# Patient Record
Sex: Female | Born: 1945 | Race: White | Hispanic: No | Smoking: Former smoker
Health system: Southern US, Community
[De-identification: ages and names within clinical notes are randomized; demographics above are authoritative.]

## PROBLEM LIST (undated history)

## (undated) DIAGNOSIS — I4891 Unspecified atrial fibrillation: Secondary | ICD-10-CM

## (undated) DIAGNOSIS — C320 Malignant neoplasm of glottis: Secondary | ICD-10-CM

## (undated) DIAGNOSIS — I10 Essential (primary) hypertension: Secondary | ICD-10-CM

## (undated) DIAGNOSIS — E119 Type 2 diabetes mellitus without complications: Secondary | ICD-10-CM

## (undated) DIAGNOSIS — I429 Cardiomyopathy, unspecified: Secondary | ICD-10-CM

## (undated) DIAGNOSIS — G2581 Restless legs syndrome: Secondary | ICD-10-CM

## (undated) DIAGNOSIS — E785 Hyperlipidemia, unspecified: Secondary | ICD-10-CM

## (undated) DIAGNOSIS — I482 Chronic atrial fibrillation, unspecified: Secondary | ICD-10-CM

## (undated) DIAGNOSIS — I5020 Unspecified systolic (congestive) heart failure: Secondary | ICD-10-CM

## (undated) DIAGNOSIS — J9621 Acute and chronic respiratory failure with hypoxia: Secondary | ICD-10-CM

## (undated) DIAGNOSIS — I639 Cerebral infarction, unspecified: Secondary | ICD-10-CM

## (undated) DIAGNOSIS — N183 Chronic kidney disease, stage 3 unspecified: Secondary | ICD-10-CM

## (undated) DIAGNOSIS — K219 Gastro-esophageal reflux disease without esophagitis: Secondary | ICD-10-CM

## (undated) HISTORY — DX: Chronic kidney disease, stage 3 unspecified: N18.30

## (undated) HISTORY — DX: Unspecified systolic (congestive) heart failure: I50.20

## (undated) HISTORY — DX: Acute and chronic respiratory failure with hypoxia: J96.21

## (undated) HISTORY — DX: Malignant neoplasm of glottis: C32.0

## (undated) HISTORY — PX: CARDIAC CATHETERIZATION: SHX172

---

## 2007-12-31 DIAGNOSIS — D72829 Elevated white blood cell count, unspecified: Secondary | ICD-10-CM | POA: Diagnosis present

## 2007-12-31 DIAGNOSIS — Z930 Tracheostomy status: Secondary | ICD-10-CM

## 2007-12-31 DIAGNOSIS — I639 Cerebral infarction, unspecified: Secondary | ICD-10-CM

## 2007-12-31 HISTORY — DX: Cerebral infarction, unspecified: I63.9

## 2014-12-19 ENCOUNTER — Inpatient Hospital Stay (HOSPITAL_COMMUNITY)
Admission: AD | Admit: 2014-12-19 | Discharge: 2014-12-22 | DRG: 280 | Disposition: A | Discharge status: Home / Self Care | Payer: Medicare Other | Source: Other Acute Inpatient Hospital | Attending: Cardiology | Admitting: Cardiology

## 2014-12-19 ENCOUNTER — Encounter (HOSPITAL_COMMUNITY): Payer: Self-pay | Admitting: Physician Assistant

## 2014-12-19 DIAGNOSIS — Y92230 Patient room in hospital as the place of occurrence of the external cause: Secondary | ICD-10-CM

## 2014-12-19 DIAGNOSIS — I428 Other cardiomyopathies: Secondary | ICD-10-CM | POA: Diagnosis present

## 2014-12-19 DIAGNOSIS — E119 Type 2 diabetes mellitus without complications: Secondary | ICD-10-CM

## 2014-12-19 DIAGNOSIS — I214 Non-ST elevation (NSTEMI) myocardial infarction: Principal | ICD-10-CM

## 2014-12-19 DIAGNOSIS — I129 Hypertensive chronic kidney disease with stage 1 through stage 4 chronic kidney disease, or unspecified chronic kidney disease: Secondary | ICD-10-CM | POA: Diagnosis present

## 2014-12-19 DIAGNOSIS — I5181 Takotsubo syndrome: Secondary | ICD-10-CM | POA: Diagnosis present

## 2014-12-19 DIAGNOSIS — I272 Other secondary pulmonary hypertension: Secondary | ICD-10-CM | POA: Diagnosis present

## 2014-12-19 DIAGNOSIS — T426X5A Adverse effect of other antiepileptic and sedative-hypnotic drugs, initial encounter: Secondary | ICD-10-CM | POA: Diagnosis not present

## 2014-12-19 DIAGNOSIS — Z7901 Long term (current) use of anticoagulants: Secondary | ICD-10-CM

## 2014-12-19 DIAGNOSIS — D72829 Elevated white blood cell count, unspecified: Secondary | ICD-10-CM | POA: Diagnosis present

## 2014-12-19 DIAGNOSIS — Z87891 Personal history of nicotine dependence: Secondary | ICD-10-CM

## 2014-12-19 DIAGNOSIS — Z79899 Other long term (current) drug therapy: Secondary | ICD-10-CM | POA: Diagnosis not present

## 2014-12-19 DIAGNOSIS — R410 Disorientation, unspecified: Secondary | ICD-10-CM | POA: Diagnosis not present

## 2014-12-19 DIAGNOSIS — I2510 Atherosclerotic heart disease of native coronary artery without angina pectoris: Secondary | ICD-10-CM | POA: Diagnosis not present

## 2014-12-19 DIAGNOSIS — E785 Hyperlipidemia, unspecified: Secondary | ICD-10-CM | POA: Diagnosis not present

## 2014-12-19 DIAGNOSIS — Z8673 Personal history of transient ischemic attack (TIA), and cerebral infarction without residual deficits: Secondary | ICD-10-CM

## 2014-12-19 DIAGNOSIS — Z881 Allergy status to other antibiotic agents status: Secondary | ICD-10-CM | POA: Diagnosis not present

## 2014-12-19 DIAGNOSIS — Z820 Family history of epilepsy and other diseases of the nervous system: Secondary | ICD-10-CM | POA: Diagnosis not present

## 2014-12-19 DIAGNOSIS — N183 Chronic kidney disease, stage 3 unspecified: Secondary | ICD-10-CM | POA: Diagnosis present

## 2014-12-19 DIAGNOSIS — K219 Gastro-esophageal reflux disease without esophagitis: Secondary | ICD-10-CM | POA: Diagnosis not present

## 2014-12-19 DIAGNOSIS — R0602 Shortness of breath: Secondary | ICD-10-CM | POA: Insufficient documentation

## 2014-12-19 DIAGNOSIS — J45909 Unspecified asthma, uncomplicated: Secondary | ICD-10-CM | POA: Diagnosis present

## 2014-12-19 DIAGNOSIS — Z7982 Long term (current) use of aspirin: Secondary | ICD-10-CM

## 2014-12-19 DIAGNOSIS — I482 Chronic atrial fibrillation, unspecified: Secondary | ICD-10-CM | POA: Diagnosis present

## 2014-12-19 DIAGNOSIS — Z885 Allergy status to narcotic agent status: Secondary | ICD-10-CM | POA: Diagnosis not present

## 2014-12-19 DIAGNOSIS — I429 Cardiomyopathy, unspecified: Secondary | ICD-10-CM

## 2014-12-19 DIAGNOSIS — Z880 Allergy status to penicillin: Secondary | ICD-10-CM | POA: Diagnosis not present

## 2014-12-19 DIAGNOSIS — I481 Persistent atrial fibrillation: Secondary | ICD-10-CM | POA: Diagnosis present

## 2014-12-19 DIAGNOSIS — I5041 Acute combined systolic (congestive) and diastolic (congestive) heart failure: Secondary | ICD-10-CM | POA: Diagnosis present

## 2014-12-19 DIAGNOSIS — E1165 Type 2 diabetes mellitus with hyperglycemia: Secondary | ICD-10-CM | POA: Diagnosis present

## 2014-12-19 HISTORY — DX: Unspecified atrial fibrillation: I48.91

## 2014-12-19 HISTORY — DX: Restless legs syndrome: G25.81

## 2014-12-19 HISTORY — DX: Hyperlipidemia, unspecified: E78.5

## 2014-12-19 HISTORY — DX: Essential (primary) hypertension: I10

## 2014-12-19 HISTORY — DX: Cardiomyopathy, unspecified: I42.9

## 2014-12-19 HISTORY — DX: Gastro-esophageal reflux disease without esophagitis: K21.9

## 2014-12-19 HISTORY — DX: Cerebral infarction, unspecified: I63.9

## 2014-12-19 HISTORY — DX: Type 2 diabetes mellitus without complications: E11.9

## 2014-12-19 HISTORY — DX: Chronic atrial fibrillation, unspecified: I48.20

## 2014-12-19 LAB — COMPREHENSIVE METABOLIC PANEL
ALT: 11 U/L (ref 0–35)
ANION GAP: 12 (ref 5–15)
AST: 15 U/L (ref 0–37)
Albumin: 3.3 g/dL — ABNORMAL LOW (ref 3.5–5.2)
Alkaline Phosphatase: 78 U/L (ref 39–117)
BUN: 30 mg/dL — AB (ref 6–23)
CO2: 26 mEq/L (ref 19–32)
CREATININE: 1.35 mg/dL — AB (ref 0.50–1.10)
Calcium: 9.8 mg/dL (ref 8.4–10.5)
Chloride: 100 mEq/L (ref 96–112)
GFR calc non Af Amer: 39 mL/min — ABNORMAL LOW (ref >90)
GFR, EST AFRICAN AMERICAN: 45 mL/min — AB (ref >90)
Glucose, Bld: 186 mg/dL — ABNORMAL HIGH (ref 70–99)
Potassium: 4.5 mEq/L (ref 3.7–5.3)
Sodium: 138 mEq/L (ref 137–147)
TOTAL PROTEIN: 7.1 g/dL (ref 6.0–8.3)
Total Bilirubin: 0.4 mg/dL (ref 0.3–1.2)

## 2014-12-19 LAB — CBC WITH DIFFERENTIAL/PLATELET
Basophils Absolute: 0 10*3/uL (ref 0.0–0.1)
Basophils Relative: 0 % (ref 0–1)
EOS ABS: 0.1 10*3/uL (ref 0.0–0.7)
Eosinophils Relative: 1 % (ref 0–5)
HCT: 34.8 % — ABNORMAL LOW (ref 36.0–46.0)
Hemoglobin: 11.2 g/dL — ABNORMAL LOW (ref 12.0–15.0)
LYMPHS ABS: 2.2 10*3/uL (ref 0.7–4.0)
Lymphocytes Relative: 25 % (ref 12–46)
MCH: 28.1 pg (ref 26.0–34.0)
MCHC: 32.2 g/dL (ref 30.0–36.0)
MCV: 87.2 fL (ref 78.0–100.0)
Monocytes Absolute: 0.6 10*3/uL (ref 0.1–1.0)
Monocytes Relative: 7 % (ref 3–12)
NEUTROS PCT: 67 % (ref 43–77)
Neutro Abs: 6.1 10*3/uL (ref 1.7–7.7)
PLATELETS: 224 10*3/uL (ref 150–400)
RBC: 3.99 MIL/uL (ref 3.87–5.11)
RDW: 15 % (ref 11.5–15.5)
WBC: 9.1 10*3/uL (ref 4.0–10.5)

## 2014-12-19 LAB — TROPONIN I
TROPONIN I: 0.78 ng/mL — AB (ref <0.30)
TROPONIN I: 0.64 ng/mL — AB (ref <0.30)

## 2014-12-19 LAB — GLUCOSE, CAPILLARY
GLUCOSE-CAPILLARY: 160 mg/dL — AB (ref 70–99)
GLUCOSE-CAPILLARY: 193 mg/dL — AB (ref 70–99)
Glucose-Capillary: 242 mg/dL — ABNORMAL HIGH (ref 70–99)

## 2014-12-19 LAB — PROTIME-INR
INR: 2.19 — ABNORMAL HIGH (ref 0.00–1.49)
PROTHROMBIN TIME: 24.5 s — AB (ref 11.6–15.2)

## 2014-12-19 LAB — MRSA PCR SCREENING: MRSA BY PCR: NEGATIVE

## 2014-12-19 MED ORDER — GABAPENTIN 100 MG PO CAPS
100.0000 mg | ORAL_CAPSULE | Freq: Every day | ORAL | Status: DC
  Administered 2014-12-19 – 2014-12-21 (×3): 100 mg via ORAL
  Filled 2014-12-19 (×3): qty 1

## 2014-12-19 MED ORDER — NITROGLYCERIN 0.4 MG SL SUBL: 0.4000 mg | SUBLINGUAL_TABLET | SUBLINGUAL | Status: DC | PRN

## 2014-12-19 MED ORDER — METOPROLOL TARTRATE 50 MG PO TABS
50.0000 mg | ORAL_TABLET | Freq: Two times a day (BID) | ORAL | Status: DC
  Administered 2014-12-19: 50 mg via ORAL
  Filled 2014-12-19: qty 1

## 2014-12-19 MED ORDER — FUROSEMIDE 20 MG PO TABS
20.0000 mg | ORAL_TABLET | Freq: Every day | ORAL | Status: DC | PRN
Start: 2014-12-19 — End: 2014-12-22
  Administered 2014-12-20: 20 mg via ORAL
  Filled 2014-12-19 (×2): qty 1

## 2014-12-19 MED ORDER — ONDANSETRON HCL 4 MG/2ML IJ SOLN
4.0000 mg | Freq: Four times a day (QID) | INTRAMUSCULAR | Status: DC | PRN
  Filled 2014-12-19 (×2): qty 2

## 2014-12-19 MED ORDER — BUPROPION HCL ER (XL) 150 MG PO TB24
150.0000 mg | ORAL_TABLET | Freq: Every day | ORAL | Status: DC
  Administered 2014-12-19 – 2014-12-22 (×4): 150 mg via ORAL
  Filled 2014-12-19 (×4): qty 1

## 2014-12-19 MED ORDER — HEPARIN (PORCINE) IN NACL 100-0.45 UNIT/ML-% IJ SOLN
1300.0000 [IU]/h | INTRAMUSCULAR | Status: DC
  Administered 2014-12-19: 1050 [IU]/h via INTRAVENOUS
  Filled 2014-12-19 (×2): qty 250

## 2014-12-19 MED ORDER — LOSARTAN POTASSIUM 25 MG PO TABS
25.0000 mg | ORAL_TABLET | Freq: Every day | ORAL | Status: DC
  Administered 2014-12-19: 25 mg via ORAL
  Filled 2014-12-19: qty 1

## 2014-12-19 MED ORDER — ISOSORBIDE MONONITRATE ER 30 MG PO TB24: 30.0000 mg | ORAL_TABLET | Freq: Every day | ORAL | Status: DC

## 2014-12-19 MED ORDER — NITROGLYCERIN IN D5W 200-5 MCG/ML-% IV SOLN
0.0000 ug/min | INTRAVENOUS | Status: DC
  Administered 2014-12-19: 5 ug/min via INTRAVENOUS

## 2014-12-19 MED ORDER — ASPIRIN EC 81 MG PO TBEC
81.0000 mg | DELAYED_RELEASE_TABLET | Freq: Every day | ORAL | Status: DC
  Administered 2014-12-20 – 2014-12-21 (×2): 81 mg via ORAL
  Filled 2014-12-19 (×2): qty 1

## 2014-12-19 MED ORDER — ZOLPIDEM TARTRATE 5 MG PO TABS
5.0000 mg | ORAL_TABLET | Freq: Every evening | ORAL | Status: DC | PRN
  Administered 2014-12-19 – 2014-12-20 (×2): 5 mg via ORAL
  Filled 2014-12-19 (×2): qty 1

## 2014-12-19 MED ORDER — DIGOXIN 125 MCG PO TABS
0.1250 mg | ORAL_TABLET | Freq: Every day | ORAL | Status: DC
  Administered 2014-12-19 – 2014-12-21 (×3): 0.125 mg via ORAL
  Filled 2014-12-19 (×3): qty 1

## 2014-12-19 MED ORDER — ACETAMINOPHEN 325 MG PO TABS: 650.0000 mg | ORAL_TABLET | Freq: Four times a day (QID) | ORAL | Status: DC | PRN

## 2014-12-19 MED ORDER — INSULIN ASPART 100 UNIT/ML ~~LOC~~ SOLN
0.0000 [IU] | Freq: Three times a day (TID) | SUBCUTANEOUS | Status: DC
  Administered 2014-12-19 – 2014-12-20 (×2): 3 [IU] via SUBCUTANEOUS
  Administered 2014-12-20 (×2): 8 [IU] via SUBCUTANEOUS
  Administered 2014-12-21 – 2014-12-22 (×3): 5 [IU] via SUBCUTANEOUS

## 2014-12-19 MED ORDER — ACETAMINOPHEN 325 MG PO TABS
650.0000 mg | ORAL_TABLET | ORAL | Status: DC | PRN
  Administered 2014-12-20 – 2014-12-21 (×3): 650 mg via ORAL
  Filled 2014-12-19 (×3): qty 2

## 2014-12-19 MED ORDER — MAGNESIUM OXIDE 400 MG PO TABS: 400.0000 mg | ORAL_TABLET | Freq: Two times a day (BID) | ORAL | Status: DC

## 2014-12-19 MED ORDER — MAGNESIUM OXIDE 400 (241.3 MG) MG PO TABS
400.0000 mg | ORAL_TABLET | Freq: Two times a day (BID) | ORAL | Status: DC
  Administered 2014-12-19 – 2014-12-22 (×6): 400 mg via ORAL
  Filled 2014-12-19 (×6): qty 1

## 2014-12-19 NOTE — Progress Notes (Addendum)
ANTICOAGULATION CONSULT NOTE - Initial Consult  Pharmacy Consult for heparin Indication: chest pain/ACS  Allergies  Allergen Reactions  . Penicillins Hives  . Amoxicillin Hives  . Morphine And Related     AMS, per son, she pulled out of her IV after morphine, however seems to be able to tolerate hydrocodone-acetaminophen    Patient Measurements: Wt= 87kg  Ht= 5' 11'' IBW: = 70.8kg Heparin dosing wt: 87kg  Vital Signs: Temp: 98.2 F (36.8 C) (12/21 1313) Temp Source: Oral (12/21 1313) BP: 127/87 mmHg (12/21 1313) Pulse Rate: 87 (12/21 1313)  Labs: No results for input(s): HGB, HCT, PLT, APTT, LABPROT, INR, HEPARINUNFRC, CREATININE, CKTOTAL, CKMB, TROPONINI in the last 72 hours.  CrCl cannot be calculated (Unknown ideal weight.).   Medical History: Past Medical History  Diagnosis Date  . Type II diabetes mellitus   . Hypertension   . Hyperlipidemia   . CVA (cerebral infarction) 2009  . GERD (gastroesophageal reflux disease)   . Chronic atrial fibrillation     on coumadin  . Asthma     Scheduled:  . [START ON 12/20/2014] aspirin EC  81 mg Oral Daily    Assessment: 68 yo female here from [place] with NSTEMI. Patient is noted on coumadin and now on hold for cath. Pharmacy has been consulted to begin heparin.  Home coumadin dose: 2.5mg /day except 5mg  on Fri  Labs at [facility]  -12/20: Hg/hct= 11.5/35.5, plt= 212 -12/21: INR= 2.3  Goal of Therapy:  Heparin level 0.3-0.7 units/ml Monitor platelets by anticoagulation protocol: Yes   Plan:  -Will begin heparin once INR < 2.0 (discussed with Almyra Deforest, PA-C) -PT/INR in am -Will follow cath plans  Hildred Laser, Pharm D 12/19/2014 4:07 PM  Addendum -The PA called and due to troponin of 0.78 has asked to begin the heparin now.  Plan -No heparin bolus -Begin heparin at 1050 units/hr (~ 12 units/kg/hr) -Heparin level in 8 hrs  Hildred Laser, Pharm D 12/19/2014 5:17 PM

## 2014-12-19 NOTE — Progress Notes (Signed)
CRITICAL VALUE ALERT  Critical value received:  Troponin 0.78  Date of notification:  12/19/14  Time of notification:  7225  Critical value read back:Yes.    Nurse who received alert:  Mickey Farber  MD notified (1st page):  Isaac Laud PA  Time of first page:  1658  MD notified (2nd page):  Time of second page:  Responding MD:    Time MD responded:

## 2014-12-19 NOTE — Progress Notes (Signed)
Pt very anxious. Expresses concerns about being home sick, pt would like something to help her sleep tonight. Will notify MD. Pt HR went to 120s when up to bathroom, and sats dropped to 89%. After pt was back to bed HR 110s, o2 is 100%. Will continue to monitor. Etta Quill, RN

## 2014-12-19 NOTE — H&P (Signed)
Patient ID: Bayley Yarborough MRN: [id number], DOB/AGE: Mar 06, 1946   Admit date: 12/19/2014   Primary Physician: No primary care provider on file. Primary Cardiologist: Dr. Tyrone Sage at [facility]. Profile:  68 year old Caucasian female with history of HTN, HLD, DM, CVA 2009, GERD and chronic atrial fibrillation on systemic anticoagulation transferred from [facility] for NSTEMI  Problem List  Past Medical History  Diagnosis Date  . Type II diabetes mellitus   . Hypertension   . Hyperlipidemia   . CVA (cerebral infarction) 2009  . GERD (gastroesophageal reflux disease)   . Chronic atrial fibrillation     on coumadin  . Asthma     Past Surgical History  Procedure Laterality Date  . Cardiac catheterization  2012-2013    at OSH, no significant blockage, felt 2/2 stress     Allergies  Allergies  Allergen Reactions  . Penicillins Hives  . Amoxicillin Hives  . Morphine And Related     HPI The patient lives near [facility]. She has a cardiologist there. She was visiting her son for the holidays in [place]. She developed chest pain yesterday and received her care at [facility].  The patient is a 68 year old Caucasian female with history of HTN, HLD, DM, CVA 2009, GERD and chronic atrial fibrillation on systemic anticoagulation. She has been followed regularly with Coumadin clinic at her primary cardiologist office. She states she has known atrial fibrillation for many years, controlled on beta blocker, digoxin, and Coumadin. She recently had a Coumadin check at her primary cardiologist office. The last cardiac catheterization she had was 2-3 years ago at [facility]. She was told there was no significant blockage and her heart problem was mainly due to stress. Patient has been doing well at home. She denies being very active however is able to do most of her laundry and day-to-day activity without exertional chest discomfort. She denies any recent fever, chill, cough,  lower extremity edema or paroxysmal nocturnal dyspnea.  She was sitting on her couch around 9:30pm on the night of 12/18/2014 when she had sudden onset of right-sided chest pain radiating to her back. She denies any shortness of breath, diaphoresis or dizziness. She sought medical attention at [facility]. Chest x-ray was negative, CT of the chest showed minimal right-sided atelectasis, lungs otherwise clear. There was no evidence of aortic dissection. The report does not specifically comment on the presence or absence of pulmonary emboli. A disc with the study has been sent to [place]. Troponin was normal at 0.03. EKG showed atrial fibrillation with heart rate 91. She was given pain medication and her chest discomfort quickly went away. Laboratory finding include sodium 134, potassium 3.9, BUN 12.9, creatinine 1.66, magnesium 1.4, glucose 317, d-dimer negative 0.27, hemoglobin 11.5, white blood cell 9.2, platelet 212, Patient was eventually discharged to home. Around 5:30 AM this morning, her chest pain returned prompting her to seek medical attention at [facility] again. CP again went again with pain medication. Repeat troponin was elevated at 0.28. EKG repeated which continued to show atrial fibrillation, however T-wave inversion in anterolateral lead was noted which is new. Patient was transferred to [facility] as an NSTEMI.  Home Medications  Pending pharmacy review  Metformin 1000 mg twice daily Coumadin 2.5 mg by mouth daily Proximal sitting in 5 mg by mouth at bedtime Isosorbide mononitrate 30 mg daily Metoprolol tartrate 50 mg twice a day Losartan 25 mg by mouth daily Digoxin 0.125 mg daily Magnesium oxide 400 mg  by mouth twice daily  Hydrocortisone-acetaminophen 1-2 tablets every 6 hours as needed for pain.  Dose 5-3 25 mg  Gabapentin 100 mg daily at bedtime       Family History  Family History  Problem Relation Age of Onset  . Alzheimer's disease Mother   . CAD  Neg Hx     Social History  History   Social History  . Marital Status: Unknown    Spouse Name: N/A    Number of Children: N/A  . Years of Education: N/A   Occupational History  . Not on file.   Social History Main Topics  . Smoking status: Former Research scientist (life sciences)  . Smokeless tobacco: Not on file     Comment: quit in 1980s  . Alcohol Use: No  . Drug Use: No  . Sexual Activity: Not on file   Other Topics Concern  . Not on file   Social History Narrative  . No narrative on file     Review of Systems General:  No chills, fever, night sweats or weight changes.  Cardiovascular:  No dyspnea on exertion, edema, orthopnea, palpitations, paroxysmal nocturnal dyspnea. R sided CP radiate to the back Dermatological: No rash, lesions/masses Respiratory: No cough, dyspnea Urologic: No hematuria, dysuria Abdominal:   No nausea, vomiting, diarrhea, bright red blood per rectum, melena, or hematemesis Neurologic:  No visual changes, wkns, changes in mental status. All other systems reviewed and are otherwise negative except as noted above.  Physical Exam  Blood pressure 127/87, pulse 87, temperature 98.2 F (36.8 C), temperature source Oral, resp. rate 13, SpO2 100 %.  General: Pleasant, NAD Psych: Normal affect. Neuro: Alert and oriented X 3. Moves all extremities spontaneously. HEENT: Normal  Neck: Supple without bruits or JVD. Lungs:irregular no s3, s4, or murmurs. Abdomen: Soft, non-tender, non-distended, BS + x 4.  Extremities: No clubbing, cyanosis or edema. DP/PT/Radials 2+ and equal bilaterally.  Labs  Lab at [facility] sodium 134, potassium 3.9, BUN 12.9, creatinine 1.66, magnesium 1.4, glucose 317, d-dimer negative 0.27, hemoglobin 11.5, white blood cell 9.2, platelet 212  ECG  A-fib with HR 90s, new TWI in anterolateral leads  ASSESSMENT AND PLAN  1. NSTEMI  - negative cath in 2012-2013, unkown result, done in [place]  - unclear if due to demand ischemia in the  setting of a-fib or underlying CAD  - trend troponin, will discuss with Dr. Ron Parker regarding repeat cath. Continue nitro gtt. Heparin for pharmacy, hold coumadin in anticipation of cath.   - obtain labs, trend serial trop. Obtain echo. Will try to obtain previous cath record  2. Renal insufficiency, unknown if acute vs chronic  -Cr 1.66 at [facility], fluid hydration  3. Chronic atrial fibrillation on systemic anticoagulation  - coumadin therapeutic at [facility], hold on admission, start IV heparin per pharmacy in anticipation of cardiac cath  4. HTN 5. HLD  6. DM - hold metformin  - likely uncontrolled with blood glucose >300 at [facility], check A1C 7. CVA 2009 8. GERD   Signed, Almyra Deforest, Hershal Coria 12/19/2014, 2:38 PM Patient seen and examined. I agree with the assessment and plan as detailed above. See also my additional thoughts below.   I spoke with the patient's son in her room. She lives in [place]. She was visiting [facility] yesterday when she had difficulties. She went to the emergency room at [facility]. Her EKG showed no abnormality in her troponin was normal. She had a CTA of the chest. The report suggests that  they looked at the aorta with no evidence of dissection. I am assuming that there was no evidence of pulmonary emboli, but it does not say that in the report. The patient went home and returned with chest pain again. On this occasion there was slight troponin elevation. She also had diffuse T-wave inversions that were not present earlier in the evening. She was put on IV nitroglycerin and transferred to our care. Her pain resolved shortly after returning back to the emergency room at [facility].  Historically she had a catheterization elsewhere several years ago. Her son says that it showed no major artery disease. He says that she was told that the problem at that time was stress. We do not have any data concerning this. He was not aware of any mention of problems with  left ventricular dysfunction.  Despite a reportedly negative cath in 2012, the patient's current presentation is very concerning. The EKG changes are significant. We will obtain more troponins and more EKGs. Even though her INR is therapeutic on Coumadin, we will start IV heparin. Coumadin will be held. Cardiac catheterization will be done when her INR level is down (assuming she does not have any unstable symptoms before then). I will see if we can have the CT scan images reviewed to be sure that there was no evidence of pulmonary emboli. 2-D echo will be obtained.  Dola Argyle, MD, [facility] 12/19/2014 4:09 PM

## 2014-12-19 NOTE — Progress Notes (Signed)
Trop 0.78, discussed with pharmacist, will start IV heparin tonight, given INR 2.19, will start IV without bolus. NPO past midnight, will reassess INR in AM to see if low enough for cath vs cath on Wed  Signed, Emmelina Mcloughlin PA Pager: [phone]

## 2014-12-20 ENCOUNTER — Inpatient Hospital Stay (HOSPITAL_COMMUNITY): Payer: Medicare Other

## 2014-12-20 DIAGNOSIS — E785 Hyperlipidemia, unspecified: Secondary | ICD-10-CM

## 2014-12-20 DIAGNOSIS — I059 Rheumatic mitral valve disease, unspecified: Secondary | ICD-10-CM

## 2014-12-20 DIAGNOSIS — Z7901 Long term (current) use of anticoagulants: Secondary | ICD-10-CM

## 2014-12-20 DIAGNOSIS — I209 Angina pectoris, unspecified: Secondary | ICD-10-CM

## 2014-12-20 DIAGNOSIS — I482 Chronic atrial fibrillation: Secondary | ICD-10-CM

## 2014-12-20 DIAGNOSIS — N183 Chronic kidney disease, stage 3 (moderate): Secondary | ICD-10-CM

## 2014-12-20 DIAGNOSIS — I10 Essential (primary) hypertension: Secondary | ICD-10-CM

## 2014-12-20 LAB — BASIC METABOLIC PANEL
Anion gap: 7 (ref 5–15)
BUN: 31 mg/dL — ABNORMAL HIGH (ref 6–23)
CALCIUM: 8.9 mg/dL (ref 8.4–10.5)
CO2: 25 mmol/L (ref 19–32)
CREATININE: 1.52 mg/dL — AB (ref 0.50–1.10)
Chloride: 102 mEq/L (ref 96–112)
GFR calc non Af Amer: 34 mL/min — ABNORMAL LOW (ref >90)
GFR, EST AFRICAN AMERICAN: 39 mL/min — AB (ref >90)
Glucose, Bld: 280 mg/dL — ABNORMAL HIGH (ref 70–99)
Potassium: 4.6 mmol/L (ref 3.5–5.1)
SODIUM: 134 mmol/L — AB (ref 135–145)

## 2014-12-20 LAB — GLUCOSE, CAPILLARY
GLUCOSE-CAPILLARY: 195 mg/dL — AB (ref 70–99)
GLUCOSE-CAPILLARY: 222 mg/dL — AB (ref 70–99)
Glucose-Capillary: 274 mg/dL — ABNORMAL HIGH (ref 70–99)
Glucose-Capillary: 302 mg/dL — ABNORMAL HIGH (ref 70–99)

## 2014-12-20 LAB — CBC
HEMATOCRIT: 36.3 % (ref 36.0–46.0)
Hemoglobin: 11.9 g/dL — ABNORMAL LOW (ref 12.0–15.0)
MCH: 28.6 pg (ref 26.0–34.0)
MCHC: 32.8 g/dL (ref 30.0–36.0)
MCV: 87.3 fL (ref 78.0–100.0)
PLATELETS: 235 10*3/uL (ref 150–400)
RBC: 4.16 MIL/uL (ref 3.87–5.11)
RDW: 15.1 % (ref 11.5–15.5)
WBC: 13.6 10*3/uL — AB (ref 4.0–10.5)

## 2014-12-20 LAB — LIPID PANEL
CHOLESTEROL: 268 mg/dL — AB (ref 0–200)
HDL: 45 mg/dL (ref >39)
LDL Cholesterol: 143 mg/dL — ABNORMAL HIGH (ref 0–99)
Total CHOL/HDL Ratio: 6 RATIO
Triglycerides: 400 mg/dL — ABNORMAL HIGH (ref <150)
VLDL: 80 mg/dL — ABNORMAL HIGH (ref 0–40)

## 2014-12-20 LAB — TROPONIN I: Troponin I: 0.47 ng/mL — ABNORMAL HIGH (ref <0.031)

## 2014-12-20 LAB — HEPARIN LEVEL (UNFRACTIONATED)
HEPARIN UNFRACTIONATED: 0.14 [IU]/mL — AB (ref 0.30–0.70)
HEPARIN UNFRACTIONATED: 0.43 [IU]/mL (ref 0.30–0.70)
Heparin Unfractionated: 0.55 IU/mL (ref 0.30–0.70)

## 2014-12-20 LAB — PROTIME-INR
INR: 2.16 — ABNORMAL HIGH (ref 0.00–1.49)
PROTHROMBIN TIME: 24.3 s — AB (ref 11.6–15.2)

## 2014-12-20 LAB — HEMOGLOBIN A1C
Hgb A1c MFr Bld: 7.8 % — ABNORMAL HIGH (ref <5.7)
MEAN PLASMA GLUCOSE: 177 mg/dL — AB (ref <117)

## 2014-12-20 MED ORDER — METOPROLOL TARTRATE 50 MG PO TABS
75.0000 mg | ORAL_TABLET | Freq: Two times a day (BID) | ORAL | Status: DC
  Administered 2014-12-20: 75 mg via ORAL
  Filled 2014-12-20 (×2): qty 1

## 2014-12-20 MED ORDER — METOPROLOL TARTRATE 100 MG PO TABS
100.0000 mg | ORAL_TABLET | Freq: Two times a day (BID) | ORAL | Status: DC
  Administered 2014-12-20 – 2014-12-22 (×4): 100 mg via ORAL
  Filled 2014-12-20 (×4): qty 1

## 2014-12-20 MED ORDER — METOPROLOL TARTRATE 25 MG PO TABS
25.0000 mg | ORAL_TABLET | Freq: Once | ORAL | Status: AC
  Administered 2014-12-20: 25 mg via ORAL
  Filled 2014-12-20: qty 1

## 2014-12-20 MED ORDER — ATORVASTATIN CALCIUM 80 MG PO TABS
80.0000 mg | ORAL_TABLET | Freq: Every day | ORAL | Status: DC
Start: 2014-12-20 — End: 2014-12-22
  Administered 2014-12-20 – 2014-12-21 (×2): 80 mg via ORAL
  Filled 2014-12-20 (×2): qty 1

## 2014-12-20 MED ORDER — IPRATROPIUM-ALBUTEROL 0.5-2.5 (3) MG/3ML IN SOLN
3.0000 mL | Freq: Four times a day (QID) | RESPIRATORY_TRACT | Status: DC
Start: 2014-12-20 — End: 2014-12-21
  Administered 2014-12-20 (×2): 3 mL via RESPIRATORY_TRACT
  Filled 2014-12-20 (×2): qty 3

## 2014-12-20 MED ORDER — HYDROMORPHONE HCL 1 MG/ML IJ SOLN
1.0000 mg | INTRAMUSCULAR | Status: DC | PRN
  Administered 2014-12-20: 1 mg via INTRAVENOUS
  Filled 2014-12-20: qty 1

## 2014-12-20 MED ORDER — MEPERIDINE HCL 25 MG/ML IJ SOLN: 25.0000 mg | INTRAMUSCULAR | Status: DC | PRN

## 2014-12-20 NOTE — Care Management Note (Unsigned)
    Page 1 of 1   12/20/2014     3:50:39 PM CARE MANAGEMENT NOTE 12/20/2014  Patient:  Angel Allen, Angel Allen   Account Number:  [id number]  Date Initiated:  12/20/2014  Documentation initiated by:  GRAVES-BIGELOW,Shelbee Apgar  Subjective/Objective Assessment:   Pt admitted as a Transfer pt from [facility]. Increased troponins + NStemi. Plan for cardiac cath.     Action/Plan:   CM will continue to monitor for disposition needs.   Anticipated DC Date:  12/22/2014   Anticipated DC Plan:  [place]  CM consult      Choice offered to / List presented to:             Status of service:  In process, will continue to follow Medicare Important Message given?   (If response is "NO", the following Medicare IM given date fields will be blank) Date Medicare IM given:   Medicare IM given by:   Date Additional Medicare IM given:   Additional Medicare IM given by:    Discharge Disposition:    Per UR Regulation:  Reviewed for med. necessity/level of care/duration of stay  If discussed at [facility] of Stay Meetings, dates discussed:    Comments:

## 2014-12-20 NOTE — Progress Notes (Signed)
ANTICOAGULATION CONSULT NOTE - Follow Up Consult  Pharmacy Consult for heparin Indication: atrial fibrillation and NSTEMI  Labs:  Recent Labs  12/19/14 1610 12/19/14 1941 12/20/14 0305 12/20/14 0314  HGB 11.2*  --   --  11.9*  HCT 34.8*  --   --  36.3  PLT 224  --   --  235  LABPROT 24.5*  --   --  24.3*  INR 2.19*  --   --  2.16*  HEPARINUNFRC  --   --  0.14*  --   CREATININE 1.35*  --   --   --   TROPONINI 0.78* 0.64*  --   --     Assessment: 68yo female subtherapeutic on heparin with initial dosing for CP while Coumadin on hold for Afib; INR remains therapeutic, cards aware but on heparin given active CP and elevated troponin.  Goal of Therapy:  Heparin level 0.3-0.7 units/ml   Plan:  Will increase heparin gtt by 3 units/kg/hr to 1300 units/hr and check level in [place], PharmD, BCPS  12/20/2014,4:16 AM

## 2014-12-20 NOTE — Progress Notes (Signed)
On-Call Cardiology Note  Subjective: I was alerted by the patient's nurse that the patient was feeling uncomfortable with a sensation of needing to clear her throat.  The patient states that she is not short of breath or having chest pain.  However, she just can't get comfortable.  She believes that some of this may be anxiety related to her upcoming catheterization, though she has never felt like this in the past.  Objective: Temp:  [97.9 F (36.6 C)-99 F (37.2 C)] 99 F (37.2 C) (12/22 0025) Pulse Rate:  [61-443] 15 (12/21 1920) Resp:  [12-24] 12 (12/21 1920) BP: (117-148)/(75-96) 135/88 mmHg (12/21 1920) SpO2:  [94 %-100 %] 99 % (12/21 1920) Gen.: Elderly woman seated in a chair next to her bed.  She appears somewhat uncomfortable. Neck: No JVD or HJR at 90. Respiratory: Normal work of breathing.  Upper airway noises noted but no wheezes or crackles.  Fair air movement. Cardiovascular: Irregularly irregular without murmurs, rubs, or gallops. Extremities: Trace pretibial edema bilaterally.  Telemetry: Atrial fibrillation with heart rates ranging from the 90s to 120s.  EKG: Atrial fibrillation with left axis deviation, 1 mm ST depression in the lateral leads, and widespread T-wave inversions involving the anterior, lateral, and inferior leads.  No significant change from prior tracing on 12/19/14.   Assessment/plan: 68 year old woman admitted with NSTEMI and atrial fibrillation. She does not have chest pain or shortness of breath at this time, though she appears anxious and has a sensation of fullness in her throat.  Exam is largely unremarkable except for trace pretibial edema.  Telemetry demonstrates persistent atrial fibrillation with fair rate control.  She is currently on a nitroglycerin drip but remains somewhat hypertensive.  Her EKG is unchanged but still concerning for ischemia given the widespread T-wave inversions and lateral ST depressions.  I will escalate the patient's  metoprolol dosing for improved rate control and blood pressure control.  I've also instructed the patient's nurse to titrate up the nitroglycerin infusion as needed for better blood pressure control.  If the patient develops chest pain or shortness of breath, we will need to consider expediting her coronary angiography.  I will also order a portable chest x-ray to evaluate for pulmonary pathology.

## 2014-12-20 NOTE — Progress Notes (Signed)
Echocardiogram 2D Echocardiogram has been performed.  Joelene Millin 12/20/2014, 9:27 AM

## 2014-12-20 NOTE — Progress Notes (Addendum)
Patient Profile: 68 year old Caucasian female with history of HTN, HLD, DM, CVA 2009, GERD and chronic atrial fibrillation on systemic anticoagulation transferred from [facility] for NSTEMI  Subjective: Still with intermittent chest pain radiating to her back and mild occasional dyspnea that is worsening this morning.   Objective: Vital signs in last 24 hours: Temp:  [97.9 F (36.6 C)-99 F (37.2 C)] 97.9 F (36.6 C) (12/22 0420) Pulse Rate:  [72-124] 87 (12/22 0420) Resp:  [12-24] 18 (12/22 0420) BP: (117-148)/(75-109) 147/102 mmHg (12/22 0420) SpO2:  [94 %-100 %] 97 % (12/22 0420) Weight:  [192 lb 8 oz (87.317 kg)] 192 lb 8 oz (87.317 kg) (12/22 0420) Last BM Date: 12/17/14  Intake/Output from previous day: 12/21 0701 - 12/22 0700 In: 480 [P.O.:480] Out: 900 [Urine:900] Intake/Output this shift:    Medications  . aspirin EC  81 mg Oral Daily  . buPROPion  150 mg Oral Daily  . digoxin  0.125 mg Oral QHS  . gabapentin  100 mg Oral QHS  . insulin aspart  0-15 Units Subcutaneous TID WC  . losartan  25 mg Oral QHS  . magnesium oxide  400 mg Oral BID  . metoprolol  75 mg Oral BID    PE: General appearance: alert, cooperative, no distress and moderately obese Lungs: wheezing B/L Heart: irregularly irregular rhythm Extremities: no LEE Pulses: 2+ and symmetric Skin: warm and dry Neurologic: Grossly normal  Lab Results:   Recent Labs  12/19/14 1610 12/20/14 0314  WBC 9.1 13.6*  HGB 11.2* 11.9*  HCT 34.8* 36.3  PLT 224 235   BMET  Recent Labs  12/19/14 1610 12/20/14 0314  NA 138 134*  K 4.5 4.6  CL 100 102  CO2 26 25  GLUCOSE 186* 280*  BUN 30* 31*  CREATININE 1.35* 1.52*  CALCIUM 9.8 8.9   PT/INR  Recent Labs  12/19/14 1610 12/20/14 0314  LABPROT 24.5* 24.3*  INR 2.19* 2.16*   Cholesterol  Recent Labs  12/20/14 0314  CHOL 268*   Cardiac Panel (last 3 results)  Recent Labs  12/19/14 1610 12/19/14 1941 12/20/14 0314  TROPONINI  0.78* 0.64* 0.47*    Studies/Results: 2D echo - pending  CXR: 12/20/2014 COMPARISON: 12/18/2014  FINDINGS: Cardiac shadow is stable. Increased interstitial changes are noted with interstitial edema consistent with mild CHF. No focal confluent infiltrate is seen. No acute bony abnormality is noted.  IMPRESSION: Mild CHF    Assessment/Plan  Active Problems:   NSTEMI (non-ST elevated myocardial infarction)   Chronic atrial fibrillation   Diabetes mellitus   CKD (chronic kidney disease)   Warfarin anticoagulation  1. NSTEMI: troponin peaked at 0.78 overnight. Troponin's trending downward 0.64-->0.47. EKG on admit also with TWIs. She continues to have breakthrough pain while on IV heparin and IV NTG. BP is in the upper 188C systolic and HR in the 166A. Will increase IV NTG. Will also give BB now to help reduce workload/myocardial oxygen demand and for added antianginal effect. Will also recheck EKG to see if there have been any other ischemic changes (patient now complaining of right sided CP radiating to back). Will continue to try to delay cath until INR is <1.7. However, if unable to control pain or if any ST elevations, will need to pursue cath sooner.  Will defer to MD use of IV morphine.   2. Chronic Atrial Fibrillation: resting rate not adequately controlled. Resting rate fluctuating in the low 100s-110s. I have asked RN to give BB dose early. She  may need further titration of Lopressor to 100 mg BID. Will hold off on increase for now since IV nitro is being titrated up, as we want to avoid hypotension. Coumadin is on hold for cath. Continue IV heparin.    3. Chronic Coumadin Therapy: on hold for cath. INR today is 2.16. INR will need to be < 1.7 for femoral cath, <1.9 for radial  4. Renal Insufficiency: SCr increased further to 1.52. Losartan was given last PM. Will hold today's dose. If 2D echo reveals normal LV function, then will hydrate aggressively overnight in  anticipation for cath.   5. DM: poorly controlled currently with blood glucose levels in the upper 200s. Hgb A1c pending. Metformin on hold for cath. Will use SS insulin.   6. Leukocytosis: WBC elevated at 13.6. She is afebrile with current temp at 98.1. She denies dysuria. No changes in the appearance or foul odor of urine. CXR w/o infiltrate. Will continue to monitor.     LOS: 1 day    Brittainy M. Ladoris Gene 12/20/2014 7:45 AM  The patient was seen, examined and discussed with Brittainy M. Rosita Fire, PA-C and I agree with the above.   The patient continues to have chest pain and SOB on iv heparin and NTG. We will add dilauded (hives after morphine), and start breathing treatments for wheezing. We will discuss with cath MD if possible earlier cath. INR today 2.1. Crea today 1.5 from 1.3 yesterday. Hold losartan, start atorvastatin, increase metoprolol to 100 mg po BID.  Dorothy Spark 12/20/2014

## 2014-12-20 NOTE — Progress Notes (Addendum)
[place] for heparin Indication: chest pain/ACS  Allergies  Allergen Reactions  . Penicillins Hives  . Amoxicillin Hives  . Morphine And Related     AMS, per son, she pulled out of her IV after morphine, however seems to be able to tolerate hydrocodone-acetaminophen    Patient Measurements: Wt= 87kg  Ht= 5' 11'' IBW: = 70.8kg Heparin dosing wt: 87kg  Vital Signs: Temp: 98 F (36.7 C) (12/22 0759) Temp Source: Oral (12/22 0759) BP: 148/90 mmHg (12/22 0837) Pulse Rate: 103 (12/22 0837)  Labs:  Recent Labs  12/19/14 1610 12/19/14 1941 12/20/14 0305 12/20/14 0314 12/20/14 1225  HGB 11.2*  --   --  11.9*  --   HCT 34.8*  --   --  36.3  --   PLT 224  --   --  235  --   LABPROT 24.5*  --   --  24.3*  --   INR 2.19*  --   --  2.16*  --   HEPARINUNFRC  --   --  0.14*  --  0.43  CREATININE 1.35*  --   --  1.52*  --   TROPONINI 0.78* 0.64*  --  0.47*  --     Estimated Creatinine Clearance: 42.1 mL/min (by C-G formula based on Cr of 1.52).  Assessment: 68 yo female here from [place] with NSTEMI. Patient was on coumadin PTA which is now on hold for cath. INR currently 2.16, needs to be <1.9 for radial approach and <1.7 for femoral approach per cardiology note. Home coumadin dose: 2.5mg /day except 5mg  on Fri.  Heparin required rate increase overnight and level now therapeutic at 0.43 units/mL. Hgb low but stable, plts WNL. No bleeding noted.  Goal of Therapy:  Heparin level 0.3-0.7 units/ml Monitor platelets by anticoagulation protocol: Yes   Plan:  1. Continue heparin at 1300 units/hr 2. Confirmatory heparin level at 2200 3. Daily HL, INR and CBC 4. Follow for s/s bleeding 5. Follow for cath plans  Lauren D. Bajbus, PharmD, BCPS Clinical Pharmacist Pager: [phone] 12/20/2014 1:27 PM    Addendum: Confirmatory heparin level is therapeutic at 0.55 on 1300 units/hr. No bleeding noted.  Continue heparin drip at 1300  units/hr F/u am labs  [facility] [place].D., BCPS Clinical Pharmacist Pager: [phone] 12/20/2014 10:44 PM

## 2014-12-21 ENCOUNTER — Encounter (HOSPITAL_COMMUNITY)
Admission: AD | Disposition: A | Discharge status: Home / Self Care | Payer: Self-pay | Source: Other Acute Inpatient Hospital | Attending: Cardiology

## 2014-12-21 ENCOUNTER — Encounter (HOSPITAL_COMMUNITY): Payer: Self-pay | Admitting: Cardiology

## 2014-12-21 DIAGNOSIS — I429 Cardiomyopathy, unspecified: Secondary | ICD-10-CM

## 2014-12-21 DIAGNOSIS — I5043 Acute on chronic combined systolic (congestive) and diastolic (congestive) heart failure: Secondary | ICD-10-CM

## 2014-12-21 DIAGNOSIS — I2510 Atherosclerotic heart disease of native coronary artery without angina pectoris: Secondary | ICD-10-CM

## 2014-12-21 HISTORY — DX: Cardiomyopathy, unspecified: I42.9

## 2014-12-21 HISTORY — PX: LEFT AND RIGHT HEART CATHETERIZATION WITH CORONARY ANGIOGRAM: SHX5449

## 2014-12-21 LAB — POCT I-STAT 3, VENOUS BLOOD GAS (G3P V)
Acid-base deficit: 3 mmol/L — ABNORMAL HIGH (ref 0.0–2.0)
BICARBONATE: 21.7 meq/L (ref 20.0–24.0)
O2 SAT: 40 %
PCO2 VEN: 35.6 mmHg — AB (ref 45.0–50.0)
PO2 VEN: 23 mmHg — AB (ref 30.0–45.0)
TCO2: 23 mmol/L (ref 0–100)
pH, Ven: 7.394 — ABNORMAL HIGH (ref 7.250–7.300)

## 2014-12-21 LAB — POCT I-STAT 3, ART BLOOD GAS (G3+)
Acid-base deficit: 3 mmol/L — ABNORMAL HIGH (ref 0.0–2.0)
Acid-base deficit: 4 mmol/L — ABNORMAL HIGH (ref 0.0–2.0)
Bicarbonate: 20.9 mEq/L (ref 20.0–24.0)
Bicarbonate: 21.4 mEq/L (ref 20.0–24.0)
O2 Saturation: 85 %
O2 Saturation: 90 %
PCO2 ART: 34.3 mmHg — AB (ref 35.0–45.0)
PCO2 ART: 37.1 mmHg (ref 35.0–45.0)
PH ART: 7.369 (ref 7.350–7.450)
TCO2: 22 mmol/L (ref 0–100)
TCO2: 23 mmol/L (ref 0–100)
pH, Arterial: 7.393 (ref 7.350–7.450)
pO2, Arterial: 49 mmHg — ABNORMAL LOW (ref 80.0–100.0)
pO2, Arterial: 60 mmHg — ABNORMAL LOW (ref 80.0–100.0)

## 2014-12-21 LAB — CBC
HCT: 33.2 % — ABNORMAL LOW (ref 36.0–46.0)
HEMATOCRIT: 34 % — AB (ref 36.0–46.0)
HEMOGLOBIN: 10.9 g/dL — AB (ref 12.0–15.0)
HEMOGLOBIN: 10.8 g/dL — AB (ref 12.0–15.0)
MCH: 28.2 pg (ref 26.0–34.0)
MCH: 28.4 pg (ref 26.0–34.0)
MCHC: 32.1 g/dL (ref 30.0–36.0)
MCHC: 32.5 g/dL (ref 30.0–36.0)
MCV: 87.9 fL (ref 78.0–100.0)
MCV: 87.4 fL (ref 78.0–100.0)
Platelets: 228 10*3/uL (ref 150–400)
Platelets: 208 10*3/uL (ref 150–400)
RBC: 3.87 MIL/uL (ref 3.87–5.11)
RBC: 3.8 MIL/uL — AB (ref 3.87–5.11)
RDW: 15.2 % (ref 11.5–15.5)
RDW: 15.1 % (ref 11.5–15.5)
WBC: 13.5 10*3/uL — AB (ref 4.0–10.5)
WBC: 12.1 10*3/uL — ABNORMAL HIGH (ref 4.0–10.5)

## 2014-12-21 LAB — GLUCOSE, CAPILLARY
GLUCOSE-CAPILLARY: 211 mg/dL — AB (ref 70–99)
GLUCOSE-CAPILLARY: 231 mg/dL — AB (ref 70–99)
Glucose-Capillary: 180 mg/dL — ABNORMAL HIGH (ref 70–99)

## 2014-12-21 LAB — HEPARIN LEVEL (UNFRACTIONATED): HEPARIN UNFRACTIONATED: 0.42 [IU]/mL (ref 0.30–0.70)

## 2014-12-21 LAB — BASIC METABOLIC PANEL
ANION GAP: 8 (ref 5–15)
BUN: 32 mg/dL — ABNORMAL HIGH (ref 6–23)
CHLORIDE: 101 meq/L (ref 96–112)
CO2: 26 mmol/L (ref 19–32)
Calcium: 8.9 mg/dL (ref 8.4–10.5)
Creatinine, Ser: 1.54 mg/dL — ABNORMAL HIGH (ref 0.50–1.10)
GFR calc Af Amer: 38 mL/min — ABNORMAL LOW (ref >90)
GFR calc non Af Amer: 33 mL/min — ABNORMAL LOW (ref >90)
Glucose, Bld: 236 mg/dL — ABNORMAL HIGH (ref 70–99)
POTASSIUM: 4.3 mmol/L (ref 3.5–5.1)
Sodium: 135 mmol/L (ref 135–145)

## 2014-12-21 LAB — PROTIME-INR
INR: 1.71 — ABNORMAL HIGH (ref 0.00–1.49)
Prothrombin Time: 20.2 seconds — ABNORMAL HIGH (ref 11.6–15.2)

## 2014-12-21 LAB — CREATININE, SERUM
CREATININE: 1.47 mg/dL — AB (ref 0.50–1.10)
GFR calc Af Amer: 41 mL/min — ABNORMAL LOW (ref >90)
GFR, EST NON AFRICAN AMERICAN: 35 mL/min — AB (ref >90)

## 2014-12-21 LAB — POCT ACTIVATED CLOTTING TIME: Activated Clotting Time: 202 seconds

## 2014-12-21 SURGERY — LEFT AND RIGHT HEART CATHETERIZATION WITH CORONARY ANGIOGRAM
Anesthesia: LOCAL

## 2014-12-21 MED ORDER — HEPARIN SODIUM (PORCINE) 5000 UNIT/ML IJ SOLN
5000.0000 [IU] | Freq: Three times a day (TID) | INTRAMUSCULAR | Status: DC
  Administered 2014-12-21 – 2014-12-22 (×3): 5000 [IU] via SUBCUTANEOUS
  Filled 2014-12-21 (×3): qty 1

## 2014-12-21 MED ORDER — SODIUM CHLORIDE 0.9 % IJ SOLN: 3.0000 mL | INTRAMUSCULAR | Status: DC | PRN

## 2014-12-21 MED ORDER — ASPIRIN EC 81 MG PO TBEC: 81.0000 mg | DELAYED_RELEASE_TABLET | Freq: Every day | ORAL | Status: DC

## 2014-12-21 MED ORDER — SODIUM CHLORIDE 0.9 % IV SOLN
INTRAVENOUS | Status: DC
  Administered 2014-12-21: 14:00:00 via INTRAVENOUS

## 2014-12-21 MED ORDER — WARFARIN SODIUM 5 MG PO TABS
5.0000 mg | ORAL_TABLET | Freq: Once | ORAL | Status: AC
  Administered 2014-12-21: 5 mg via ORAL
  Filled 2014-12-21: qty 1

## 2014-12-21 MED ORDER — NITROGLYCERIN 1 MG/10 ML FOR IR/CATH LAB
INTRA_ARTERIAL | Status: AC
  Filled 2014-12-21: qty 10

## 2014-12-21 MED ORDER — SODIUM CHLORIDE 0.9 % IJ SOLN: 3.0000 mL | Freq: Two times a day (BID) | INTRAMUSCULAR | Status: DC

## 2014-12-21 MED ORDER — SODIUM CHLORIDE 0.9 % IV SOLN: 250.0000 mL | INTRAVENOUS | Status: DC

## 2014-12-21 MED ORDER — IPRATROPIUM-ALBUTEROL 0.5-2.5 (3) MG/3ML IN SOLN
3.0000 mL | Freq: Three times a day (TID) | RESPIRATORY_TRACT | Status: DC
  Administered 2014-12-21 – 2014-12-22 (×4): 3 mL via RESPIRATORY_TRACT
  Filled 2014-12-21 (×4): qty 3

## 2014-12-21 MED ORDER — ASPIRIN 81 MG PO CHEW: 81.0000 mg | CHEWABLE_TABLET | ORAL | Status: DC

## 2014-12-21 MED ORDER — FENTANYL CITRATE 0.05 MG/ML IJ SOLN
INTRAMUSCULAR | Status: AC
  Filled 2014-12-21: qty 2

## 2014-12-21 MED ORDER — WARFARIN - PHARMACIST DOSING INPATIENT: Freq: Every day | Status: DC

## 2014-12-21 MED ORDER — VERAPAMIL HCL 2.5 MG/ML IV SOLN
INTRAVENOUS | Status: AC
  Filled 2014-12-21: qty 2

## 2014-12-21 MED ORDER — INSULIN GLARGINE 100 UNIT/ML ~~LOC~~ SOLN
13.0000 [IU] | Freq: Every day | SUBCUTANEOUS | Status: DC
  Administered 2014-12-21: 13 [IU] via SUBCUTANEOUS
  Filled 2014-12-21 (×2): qty 0.13

## 2014-12-21 MED ORDER — HEPARIN SODIUM (PORCINE) 1000 UNIT/ML IJ SOLN
INTRAMUSCULAR | Status: AC
  Filled 2014-12-21: qty 1

## 2014-12-21 MED ORDER — ISOSORBIDE MONONITRATE ER 30 MG PO TB24
30.0000 mg | ORAL_TABLET | Freq: Every day | ORAL | Status: DC
  Administered 2014-12-21 – 2014-12-22 (×2): 30 mg via ORAL
  Filled 2014-12-21 (×2): qty 1

## 2014-12-21 MED ORDER — HEPARIN (PORCINE) IN NACL 2-0.9 UNIT/ML-% IJ SOLN
INTRAMUSCULAR | Status: AC
  Filled 2014-12-21: qty 1500

## 2014-12-21 MED ORDER — HEART ATTACK BOUNCING BOOK
Freq: Once | Status: AC
  Administered 2014-12-21: 07:00:00
  Filled 2014-12-21: qty 1

## 2014-12-21 MED ORDER — LIDOCAINE HCL (PF) 1 % IJ SOLN
INTRAMUSCULAR | Status: AC
  Filled 2014-12-21: qty 30

## 2014-12-21 MED ORDER — SODIUM CHLORIDE 0.9 % IV SOLN: 250.0000 mL | INTRAVENOUS | Status: DC | PRN

## 2014-12-21 MED ORDER — MIDAZOLAM HCL 2 MG/2ML IJ SOLN
INTRAMUSCULAR | Status: AC
  Filled 2014-12-21: qty 2

## 2014-12-21 MED ORDER — SODIUM CHLORIDE 0.9 % IJ SOLN
3.0000 mL | Freq: Two times a day (BID) | INTRAMUSCULAR | Status: DC
  Administered 2014-12-21 – 2014-12-22 (×3): 3 mL via INTRAVENOUS

## 2014-12-21 MED ORDER — NITROGLYCERIN IN D5W 200-5 MCG/ML-% IV SOLN
0.0000 ug/min | INTRAVENOUS | Status: DC
  Administered 2014-12-21: 33.333 ug/min via INTRAVENOUS

## 2014-12-21 NOTE — Brief Op Note (Signed)
   NAME:  Angel Allen   MRN: [id number] DOB:  [birth year]   ADMIT DATE: 12/19/2014  Brief R&L Heart Catheterization Note:  Indication: 1. ~NSTEMI / Elevated Troponin. 2. 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 RHC: 7 Fr RFV - fluoro guided - 7 Fr Swan Cath - RA-PA/PCWP & RV  2. 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 Interventional Cardiologist   Pager # [phone]     12/21/2014 12:42 PM

## 2014-12-21 NOTE — Clinical Documentation Improvement (Signed)
Chronic kidney disease is documented in current record.  Please specify the stage of CKD if possible and document in your progress note and discharge summary. Also document any underlying cause of CKD such as Diabetes or Hypertension if known.    Possible Clinical Conditions: --stage 2 (mild) - GFR 60-89 --stage 3 (moderate) - GFR 30-59 --stage 4 (severe) - GFR 15-29 --Unable to determine at present  Thank you, Mateo Flow, RN [phone] Clinical Documentation Specialist     :

## 2014-12-21 NOTE — Progress Notes (Signed)
[facility] for heparin Indication: chest pain/ACS  Allergies  Allergen Reactions  . Penicillins Hives  . Amoxicillin Hives  . Morphine And Related     AMS, per son, she pulled out of her IV after morphine, however seems to be able to tolerate hydrocodone-acetaminophen    Patient Measurements: Wt= 87kg  Ht= 5' 11'' IBW: = 70.8kg Heparin dosing wt: 87kg  Vital Signs: Temp: 97.7 F (36.5 C) (12/23 0900) Temp Source: Oral (12/23 0900) BP: 138/87 mmHg (12/23 0900) Pulse Rate: 83 (12/23 0900)  Labs:  Recent Labs  12/19/14 1610 12/19/14 1941  12/20/14 0314 12/20/14 1225 12/20/14 2214 12/21/14 0500  HGB 11.2*  --   --  11.9*  --   --  10.9*  HCT 34.8*  --   --  36.3  --   --  34.0*  PLT 224  --   --  235  --   --  228  LABPROT 24.5*  --   --  24.3*  --   --  20.2*  INR 2.19*  --   --  2.16*  --   --  1.71*  HEPARINUNFRC  --   --   < >  --  0.43 0.55 0.42  CREATININE 1.35*  --   --  1.52*  --   --   --   TROPONINI 0.78* 0.64*  --  0.47*  --   --   --   < > = values in this interval not displayed.  Estimated Creatinine Clearance: 42.1 mL/min (by C-G formula based on Cr of 1.52).  Assessment: 68 yo female here from [place] with NSTEMI. Patient was on coumadin PTA which is now on hold for cath. INR currently 1.7, planning for cath today.  Home coumadin dose: 2.5mg /day except 5mg  on Fri.  Heparin has been therapeutic x3 on 1300 units/hr. Hgb low but stable, plts wnl. No bleeding noted.  Goal of Therapy:  Heparin level 0.3-0.7 units/ml Monitor platelets by anticoagulation protocol: Yes   Plan:  1. Continue heparin at 1300 units/hr 2. Daily HL and CBC while on heparin 3. Follow for s/s bleeding 4. Follow up plans post cath  Jazlene Bares D. Tabathia Knoche, PharmD, BCPS Clinical Pharmacist Pager: [phone] 12/21/2014 10:12 AM

## 2014-12-21 NOTE — Progress Notes (Signed)
Subjective: Pt very anxious concerning cath.  Though she did have confusion with Ambien last pm,  Hesitant to give before she goes for cath.  Objective: Vital signs in last 24 hours: Temp:  [97.5 F (36.4 C)-98.3 F (36.8 C)] 97.7 F (36.5 C) (12/23 0900) Pulse Rate:  [45-94] 83 (12/23 0900) Resp:  [10-23] 15 (12/22 1545) BP: (118-151)/(74-119) 138/87 mmHg (12/23 0900) SpO2:  [89 %-98 %] 94 % (12/23 0937) Weight:  [192 lb 14.4 oz (87.499 kg)] 192 lb 14.4 oz (87.499 kg) (12/23 0458) Weight change: 6.4 oz (0.181 kg) Last BM Date: 12/17/14 Intake/Output from previous day: -272 (-692 since admit)  12/22 0701 - 12/23 0700 In: 727.1 [P.O.:120; I.V.:607.1] Out: 1000 [Urine:1000] Intake/Output this shift:    PE: General:Pleasant affect, NAD Skin:Warm and dry, brisk capillary refill HEENT:normocephalic, sclera clear, mucus membranes moist Heart:irreg irreg without murmur, gallup, rub or click Lungs:clear without rales, rhonchi, or wheezes RKY:HCWC, non tender, + BS, do not palpate liver spleen or masses Ext:no lower ext edema, 2+ pedal pulses, 2+ radial pulses Neuro:alert and oriented X 3, MAE, follows commands, + facial symmetry  Tele: a fib rate controlled   Lab Results:  Recent Labs  12/20/14 0314 12/21/14 0500  WBC 13.6* 13.5*  HGB 11.9* 10.9*  HCT 36.3 34.0*  PLT 235 228   BMET  Recent Labs  12/19/14 1610 12/20/14 0314  NA 138 134*  K 4.5 4.6  CL 100 102  CO2 26 25  GLUCOSE 186* 280*  BUN 30* 31*  CREATININE 1.35* 1.52*  CALCIUM 9.8 8.9    Recent Labs  12/19/14 1941 12/20/14 0314  TROPONINI 0.64* 0.47*    Lab Results  Component Value Date   CHOL 268* 12/20/2014   HDL 45 12/20/2014   LDLCALC 143* 12/20/2014   TRIG 400* 12/20/2014   CHOLHDL 6.0 12/20/2014   Lab Results  Component Value Date   HGBA1C 7.8* 12/19/2014     No results found for: TSH  Hepatic Function Panel  Recent Labs  12/19/14 1610  PROT 7.1  ALBUMIN 3.3*    AST 15  ALT 11  ALKPHOS 78  BILITOT 0.4    Recent Labs  12/20/14 0314  CHOL 268*   No results for input(s): PROTIME in the last 72 hours.     Studies/Results: Dg Chest Port 1 View  12/20/2014   CLINICAL DATA:  Shortness of breath  EXAM: PORTABLE CHEST - 1 VIEW  COMPARISON:  12/18/2014  FINDINGS: Cardiac shadow is stable. Increased interstitial changes are noted with interstitial edema consistent with mild CHF. No focal confluent infiltrate is seen. No acute bony abnormality is noted.  IMPRESSION: Mild CHF   Electronically Signed   By: Inez Catalina M.D.   On: 12/20/2014 07:58  ECHO: Left ventricle: Severe hypokinesis of septum apex mid and distal anterior and inferior walls Distribution may be consistant with Takatsubo. The cavity size was severely dilated. Wall thickness was increased in a pattern of mild LVH. Systolic function was severely reduced. The estimated ejection fraction was in the range of 20% to 25%. - Mitral valve: There was mild regurgitation. - Left atrium: The atrium was mildly dilated. - Right atrium: The atrium was mildly dilated. - Atrial septum: No defect or patent foramen ovale was identified. - Pulmonary arteries: PA peak pressure: 43 mm Hg (S).      Medications: I have reviewed the patient's current medications. Scheduled Meds: . aspirin EC  81 mg  Oral Daily  . atorvastatin  80 mg Oral q1800  . buPROPion  150 mg Oral Daily  . digoxin  0.125 mg Oral QHS  . gabapentin  100 mg Oral QHS  . insulin aspart  0-15 Units Subcutaneous TID WC  . ipratropium-albuterol  3 mL Nebulization TID  . magnesium oxide  400 mg Oral BID  . metoprolol  100 mg Oral BID   Continuous Infusions: . heparin 1,300 Units/hr (12/20/14 0425)  . nitroGLYCERIN 20 mcg/min (12/20/14 0816)   PRN Meds:.acetaminophen, furosemide, HYDROmorphone (DILAUDID) injection, nitroGLYCERIN, ondansetron (ZOFRAN) IV, zolpidem  Assessment/Plan: 68 year old Caucasian female with  history of HTN, HLD, DM, CVA 2009, GERD and chronic atrial fibrillation on systemic anticoagulation transferred from [facility] for NSTEMI  Principal Problem:   NSTEMI (non-ST elevated myocardial infarction), pk troponin 0.78, with decrease in INR will plan for cath today.  And with EF 20-25% will also do Rt heart cath.    Active Problems:   Cardiomyopathy, EF 20-25% possible Takatsubo- on BB lopressor 100 mg BID- new higher dose, no ACE or ARB now with cath and CKD-3, may be able to start low dose as outpt. She is on Dig.     Chronic atrial fibrillation-rate controlled in the 80s    Diabetes mellitus- glucose 160-242 (Hgb A1C is 7.8)- metformin is on hold she is on SSI, er diabetes coordinator will add Lantus 13 units at HS for now while off metformin and with elevated glucose.    CKD (chronic kidney disease) stage 3, GFR 30-59 ml/min    Warfarin anticoagulation- INR is now 1.71today on Heparin IV    LOS: 2 days   Time spent with pt. :15 minutes. [facility] R  Nurse Practitioner Certified Pager [phone] or after 5pm and on weekends call [phone] 12/21/2014, 9:43 AM   Pt seen in cath lab for planned cath - NSTEMI, Cardiomyopathy.  Leonie Man, MD

## 2014-12-21 NOTE — CV Procedure (Signed)
CARDIAC CATHETERIZATION REPORT  NAME:  Angel Allen   MRN: [id number] DOB:  1946-07-31   ADMIT DATE: 12/19/2014 Procedure Date: 12/21/2014  INTERVENTIONAL CARDIOLOGIST: Leonie Man, M.D., MS PRIMARY CARE PROVIDER: No primary care provider on file. PRIMARY CARDIOLOGIST: Dr. Grover Canavan - Joelyn Oms, [place]  PATIENT:  Angel Allen is a 68 y.o. female with history of hypertension, hyperlipidemia, type 2 diabetes mellitus and prior stroke in 2009 who has known chronic atrial fibrillation. She presented from [facility] with signs and symptoms consistent with non-ST elevation MI and acute heart failure. She has ruled in for possible MI with elevated troponin, but has been found echocardiogram to have severely reduced ejection fraction consistent with possible Takotsubo cardiomyopathy she is now referred for right left heart catheterization. 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  Left Ventriculography  PROCEDURE: The patient was brought to the 2nd [facility] in the fasting state and prepped and draped in the usual sterile fashion for right femoral arterial or radial access. A modified Allen's test was performed on the right wrist demonstrating excellent collateral flow. Sterile technique was used including antiseptics, cap, gloves, gown, hand hygiene, mask and sheet. Skin prep: Chlorhexidine.   Consent: Risks of procedure as well as the alternatives and risks of each were explained to the (patient/caregiver). Consent for procedure obtained.   Time Out: Verified patient identification, verified procedure, site/side was marked, verified correct patient position, special  equipment/implants available, medications/allergies/relevent history reviewed, required imaging and test results available. Performed.  Access:  Right Radial Artery: 6 Fr sheath -- Seldinger technique using Angiocath Micropuncture Kit 10 mL radial cocktail IA; 4500 Units IV Heparin Right Common Femoral Vein: 7 Fr Sheath - Seldinger Technique..  Right Heart Catheterization: 7 Fr Gordy Councilman catheter advanced under fluoroscopy with balloon inflated to the RA, RV, then PCWP-PA for hemodynamic measurement.  Simultaneous FA & PA blood gases checked for SaO2% to calculate FICK CO/CI  Thermodilution Injections performed to calculate CO/CI  Simultaneous PCWP/LV & RV/LV pressures monitored with Angled Pigtail in LV.  Catheter removed completely out of the body with balloon deflated.  Left Heart Catheterization: 5 Fr Catheters advanced a Versicore wire due to extreme tortuosity in the innominate artery. Catheters were then exchanged over a J-wire; TIG 4.0 catheter advanced first.  LV Hemodynamics (LV Gram): Angled pigtail Left Coronary Artery Cineangiography: TIG 4.0 Catheter  Right Coronary Artery Cineangiography: JR4 Catheter   Radial sheath removed in Cath Lab with TR band placement. Venous sheath was also removed in the Cath Lab  with manual pressure for hemostasis.  TR band:  12:30 hours; 14 mL air  EBL: < 20 ml, not including ABG and VBG samples   FINDINGS:  Hemodynamics:  Findings:   SaO2%  Pressures mmHg  Mean P  mmHg  EDP  mmHg   Right Atrium    12/12    12   Right Ventricle    39/8    10   Pulmonary Artery   40   41/19   28    PCWP    25/33   26    Central Aortic   90   111/81   94    Left Ventricle    114/16  24          Cardiac Output:   Cardiac Index:    Fick   3.75    1.79    Thermodilution   3.46    1.64     Left Ventriculography: Not performed to conserve contrast  Coronary Anatomy:  Left Main: Large-caliber vessel. Bifurcates into the LAD and Circumflex with a very  small Ramus Intermedius. Angiographically normal. LAD: Large-caliber vessel that wraps down around the apex. It gives rise to a relatively proximal first diagonal branch all the septal perforator. After the septal perforator there is a eccentric 40-50% stenosis. The vessel then normalizes it courses down around the apex. Several small diagonal branches noted.  D1: Large-caliber, major branch vessel with several distal branches. Tortuous but free of significant disease.  Left Circumflex: Large-caliber, nondominant vessel. As noted there is a small ramus intermedius proximally. It then gives off a small moderate caliber first OM prior to terminating as a large lateral OM 2/LPL 1 that gives off a small moderate posterior AV groove branch.  OM1: Small-moderate caliber branch. Bifurcates proximally. Angiographically normal but tortuous.  OM 2: Large-caliber lateral/posterolateral branch. Tortuous but free of disease. There is a may be a 30% stenosis between OM 1 and 2   RCA: Very large caliber, dominant vessel. There is several marginal branches. The vessel bifurcates distally into a large caliber posterior descending artery (RPDA) and the  Right Posterior AV Groove Branch (RPAV). At that bifurcation in the distal RCA there is a focal 40-50% stenosis. Otherwise no significant disease.  RPDA: Large-caliber vessel that courses almost all the way to the apex. Tortuous but free of disease.  RPL Sysytem:The RPAV large-caliber vessel it gives off several posterolateral branches but terminus is a very large branch with 2 distal posterolateral branches that almost run like obtuse marginals.  After reviewing the initial angiography, no culprit lesion was identified.  Marland Kitchen  MEDICATIONS:  Anesthesia:  Local Lidocaine 2 mL for radial, 10 mL right groin  Sedation:  1 mg IV Versed, 25 mcg IV fentanyl ;   Omnipaque Contrast:  70 ml  Anticoagulation:  IV Heparin  4500 Units ;   PATIENT DISPOSITION:    The  patient was transferred to the PACU holding area in a hemodynamicaly stable, chest pain free condition.  The patient tolerated the procedure well, and there were no complications.  EBL:   < 20 ml  The patient was stable before, during, and after the procedure.  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  Moderate to severely reduced cardiac output by Fick and thermal dilution.  Moderate to severely elevated LVEDP with correlating Wedge pressure and mean PA pressure.  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 [facility] [address] [place], [place]  [id number]  [phone]  12/21/2014 12:39 PM

## 2014-12-21 NOTE — Progress Notes (Addendum)
Inpatient Diabetes Program Recommendations  AACE/ADA: New Consensus Statement on Inpatient Glycemic Control (2013)  Target Ranges:  Prepandial:   less than 140 mg/dL      Peak postprandial:   less than 180 mg/dL (1-2 hours)      Critically ill patients:  140 - 180 mg/dL     Results for JADDA, HUNSUCKER (MRN [id number]) as of 12/21/2014 07:42  Ref. Range 12/20/2014 07:39 12/20/2014 11:56 12/20/2014 16:35 12/20/2014 21:36  Glucose-Capillary Latest Range: 70-99 mg/dL 274 (H) 195 (H) 302 (H) 222 (H)    Results for ANGELLEE, COHILL (MRN [id number]) as of 12/21/2014 07:42  Ref. Range 12/19/2014 16:10  Hgb A1c MFr Bld Latest Range: <5.7 % 7.8 (H)    Home DM Meds: Metformin 1000 mg bid   Current Orders: Novolog Moderate SSI   **Patient with decent glucose control at home with Metformin alone as evidenced by A1c of 7.8%.  **Note Metformin currently on hold in anticipation of cardiac cath.  **Fasting glucose elevated this AM   MD- Please consider adding low dose basal insulin while patient hospitalized- Lantus 13 units QHS (0.15 units/kg dosing)    Will follow Wyn Quaker RN, MSN, CDE Diabetes Coordinator Inpatient Diabetes Program Team Pager: [phone] (8a-10p)

## 2014-12-21 NOTE — Interval H&P Note (Signed)
History and Physical Interval Note:  12/21/2014 11:41 AM  Angel Allen  has presented today for surgery, with the diagnosis of Elevated Troponin - NSTEMI with Cardiomyopathy.  The various methods of treatment have been discussed with the patient and family. After consideration of risks, benefits and other options for treatment, the patient has consented to  Procedure(s): LEFT AND RIGHT HEART CATHETERIZATION WITH CORONARY ANGIOGRAM (N/A) +/- PCIas a surgical intervention .  The patient's history has been reviewed, patient examined, no change in status, stable for surgery.  I have reviewed the patient's chart and labs.  Questions were answered to the patient's satisfaction.    The procedure with Risks/Benefits/Alternatives and Indications was reviewed with the patient.  All questions were answered.    Risks / Complications include, but not limited to: Death, MI, CVA/TIA, VF/VT (with defibrillation), Bradycardia (need for temporary pacer placement), contrast induced nephropathy, bleeding / bruising / hematoma / pseudoaneurysm, vascular or coronary injury (with possible emergent CT or Vascular Surgery), adverse medication reactions, infection.  Additional risks involving the use of radiation with the possibility of radiation burns and cancer were explained in detail.  The patient voices understanding and agreesto proceed.    Cath Lab Visit (complete for each Cath Lab visit)  Clinical Evaluation Leading to the Procedure:   ACS: Yes.    Non-ACS:    Anginal Classification: CCS IV  Anti-ischemic medical therapy: Maximal Therapy (2 or more classes of medications)  Non-Invasive Test Results: High-risk stress test findings: cardiac mortality >3%/year; EF 20-25%  Prior CABG: No previous CABG        [name] W

## 2014-12-21 NOTE — Progress Notes (Signed)
[facility] for coumadin  Indication: chronic afib  Allergies  Allergen Reactions  . Penicillins Hives  . Amoxicillin Hives  . Morphine And Related     AMS, per son, she pulled out of her IV after morphine, however seems to be able to tolerate hydrocodone-acetaminophen    Patient Measurements: Wt= 87kg  Ht= 5' 11'' IBW: = 70.8kg Heparin dosing wt: 87kg  Vital Signs: Temp: 97.7 F (36.5 C) (12/23 0900) Temp Source: Oral (12/23 0900) BP: 112/70 mmHg (12/23 1503) Pulse Rate: 65 (12/23 1503)  Labs:  Recent Labs  12/19/14 1610 12/19/14 1941  12/20/14 0314 12/20/14 1225 12/20/14 2214 12/21/14 0500 12/21/14 1020 12/21/14 1355  HGB 11.2*  --   --  11.9*  --   --  10.9*  --  10.8*  HCT 34.8*  --   --  36.3  --   --  34.0*  --  33.2*  PLT 224  --   --  235  --   --  228  --  208  LABPROT 24.5*  --   --  24.3*  --   --  20.2*  --   --   INR 2.19*  --   --  2.16*  --   --  1.71*  --   --   HEPARINUNFRC  --   --   < >  --  0.43 0.55 0.42  --   --   CREATININE 1.35*  --   --  1.52*  --   --   --  1.54* 1.47*  TROPONINI 0.78* 0.64*  --  0.47*  --   --   --   --   --   < > = values in this interval not displayed.  Estimated Creatinine Clearance: 43.6 mL/min (by C-G formula based on Cr of 1.47).  Assessment: 68 yo female here from [place] with NSTEMI. Patient was on coumadin PTA for chronic afib which was on hold for earlier cath.  INR 1.71 this am. Orders to resume warfarin post-cath. Heparin stopped. No overt complications noted from cath. (Radial)  Home coumadin dose: 2.5mg /day except 5mg  on Fri.  Goal of Therapy:  INR goal 2-3 Monitor platelets by anticoagulation protocol: Yes   Plan:  1. Warfarin 5mg  tonight 2. Daily INR  Erin Hearing PharmD., BCPS Clinical Pharmacist Pager [phone] 12/21/2014 4:44 PM

## 2014-12-21 NOTE — H&P (View-Only) (Signed)
Subjective: Pt very anxious concerning cath.  Though she did have confusion with Ambien last pm,  Hesitant to give before she goes for cath.  Objective: Vital signs in last 24 hours: Temp:  [97.5 F (36.4 C)-98.3 F (36.8 C)] 97.7 F (36.5 C) (12/23 0900) Pulse Rate:  [45-94] 83 (12/23 0900) Resp:  [10-23] 15 (12/22 1545) BP: (118-151)/(74-119) 138/87 mmHg (12/23 0900) SpO2:  [89 %-98 %] 94 % (12/23 0937) Weight:  [192 lb 14.4 oz (87.499 kg)] 192 lb 14.4 oz (87.499 kg) (12/23 0458) Weight change: 6.4 oz (0.181 kg) Last BM Date: 12/17/14 Intake/Output from previous day: -272 (-692 since admit)  12/22 0701 - 12/23 0700 In: 727.1 [P.O.:120; I.V.:607.1] Out: 1000 [Urine:1000] Intake/Output this shift:    PE: General:Pleasant affect, NAD Skin:Warm and dry, brisk capillary refill HEENT:normocephalic, sclera clear, mucus membranes moist Heart:irreg irreg without murmur, gallup, rub or click Lungs:clear without rales, rhonchi, or wheezes RDE:YCXK, non tender, + BS, do not palpate liver spleen or masses Ext:no lower ext edema, 2+ pedal pulses, 2+ radial pulses Neuro:alert and oriented X 3, MAE, follows commands, + facial symmetry  Tele: a fib rate controlled   Lab Results:  Recent Labs  12/20/14 0314 12/21/14 0500  WBC 13.6* 13.5*  HGB 11.9* 10.9*  HCT 36.3 34.0*  PLT 235 228   BMET  Recent Labs  12/19/14 1610 12/20/14 0314  NA 138 134*  K 4.5 4.6  CL 100 102  CO2 26 25  GLUCOSE 186* 280*  BUN 30* 31*  CREATININE 1.35* 1.52*  CALCIUM 9.8 8.9    Recent Labs  12/19/14 1941 12/20/14 0314  TROPONINI 0.64* 0.47*    Lab Results  Component Value Date   CHOL 268* 12/20/2014   HDL 45 12/20/2014   LDLCALC 143* 12/20/2014   TRIG 400* 12/20/2014   CHOLHDL 6.0 12/20/2014   Lab Results  Component Value Date   HGBA1C 7.8* 12/19/2014     No results found for: TSH  Hepatic Function Panel  Recent Labs  12/19/14 1610  PROT 7.1  ALBUMIN 3.3*    AST 15  ALT 11  ALKPHOS 78  BILITOT 0.4    Recent Labs  12/20/14 0314  CHOL 268*   No results for input(s): PROTIME in the last 72 hours.     Studies/Results: Dg Chest Port 1 View  12/20/2014   CLINICAL DATA:  Shortness of breath  EXAM: PORTABLE CHEST - 1 VIEW  COMPARISON:  12/18/2014  FINDINGS: Cardiac shadow is stable. Increased interstitial changes are noted with interstitial edema consistent with mild CHF. No focal confluent infiltrate is seen. No acute bony abnormality is noted.  IMPRESSION: Mild CHF   Electronically Signed   By: Inez Catalina M.D.   On: 12/20/2014 07:58  ECHO: Left ventricle: Severe hypokinesis of septum apex mid and distal anterior and inferior walls Distribution may be consistant with Takatsubo. The cavity size was severely dilated. Wall thickness was increased in a pattern of mild LVH. Systolic function was severely reduced. The estimated ejection fraction was in the range of 20% to 25%. - Mitral valve: There was mild regurgitation. - Left atrium: The atrium was mildly dilated. - Right atrium: The atrium was mildly dilated. - Atrial septum: No defect or patent foramen ovale was identified. - Pulmonary arteries: PA peak pressure: 43 mm Hg (S).      Medications: I have reviewed the patient's current medications. Scheduled Meds: . aspirin EC  81 mg  Oral Daily  . atorvastatin  80 mg Oral q1800  . buPROPion  150 mg Oral Daily  . digoxin  0.125 mg Oral QHS  . gabapentin  100 mg Oral QHS  . insulin aspart  0-15 Units Subcutaneous TID WC  . ipratropium-albuterol  3 mL Nebulization TID  . magnesium oxide  400 mg Oral BID  . metoprolol  100 mg Oral BID   Continuous Infusions: . heparin 1,300 Units/hr (12/20/14 0425)  . nitroGLYCERIN 20 mcg/min (12/20/14 0816)   PRN Meds:.acetaminophen, furosemide, HYDROmorphone (DILAUDID) injection, nitroGLYCERIN, ondansetron (ZOFRAN) IV, zolpidem  Assessment/Plan: 68 year old Caucasian female with  history of HTN, HLD, DM, CVA 2009, GERD and chronic atrial fibrillation on systemic anticoagulation transferred from [facility] for NSTEMI  Principal Problem:   NSTEMI (non-ST elevated myocardial infarction), pk troponin 0.78, with decrease in INR will plan for cath today.  And with EF 20-25% will also do Rt heart cath.    Active Problems:   Cardiomyopathy, EF 20-25% possible Takatsubo- on BB lopressor 100 mg BID- new higher dose, no ACE or ARB now with cath and CKD-3, may be able to start low dose as outpt. She is on Dig.     Chronic atrial fibrillation-rate controlled in the 80s    Diabetes mellitus- glucose 160-242 (Hgb A1C is 7.8)- metformin is on hold she is on SSI, er diabetes coordinator will add Lantus 13 units at HS for now while off metformin and with elevated glucose.    CKD (chronic kidney disease) stage 3, GFR 30-59 ml/min    Warfarin anticoagulation- INR is now 1.71today on Heparin IV    LOS: 2 days   Time spent with pt. :15 minutes. [facility] R  Nurse Practitioner Certified Pager [phone] or after 5pm and on weekends call [phone] 12/21/2014, 9:43 AM   Pt seen in cath lab for planned cath - NSTEMI, Cardiomyopathy.  Leonie Man, MD

## 2014-12-22 ENCOUNTER — Other Ambulatory Visit: Payer: Self-pay | Admitting: Physician Assistant

## 2014-12-22 DIAGNOSIS — I429 Cardiomyopathy, unspecified: Secondary | ICD-10-CM

## 2014-12-22 DIAGNOSIS — R0602 Shortness of breath: Secondary | ICD-10-CM

## 2014-12-22 LAB — CBC
HEMATOCRIT: 31.8 % — AB (ref 36.0–46.0)
HEMOGLOBIN: 10.2 g/dL — AB (ref 12.0–15.0)
MCH: 28.5 pg (ref 26.0–34.0)
MCHC: 32.1 g/dL (ref 30.0–36.0)
MCV: 88.8 fL (ref 78.0–100.0)
Platelets: 194 10*3/uL (ref 150–400)
RBC: 3.58 MIL/uL — ABNORMAL LOW (ref 3.87–5.11)
RDW: 15.5 % (ref 11.5–15.5)
WBC: 9.1 10*3/uL (ref 4.0–10.5)

## 2014-12-22 LAB — PROTIME-INR
INR: 1.57 — AB (ref 0.00–1.49)
Prothrombin Time: 18.9 seconds — ABNORMAL HIGH (ref 11.6–15.2)

## 2014-12-22 LAB — GLUCOSE, CAPILLARY: GLUCOSE-CAPILLARY: 202 mg/dL — AB (ref 70–99)

## 2014-12-22 MED ORDER — NITROGLYCERIN 0.4 MG SL SUBL: 0.4000 mg | SUBLINGUAL_TABLET | SUBLINGUAL | Status: AC | PRN

## 2014-12-22 MED ORDER — ASPIRIN 81 MG PO TBEC: 81.0000 mg | DELAYED_RELEASE_TABLET | Freq: Every day | ORAL | Status: DC

## 2014-12-22 MED ORDER — NITROGLYCERIN 0.4 MG SL SUBL: 0.4000 mg | SUBLINGUAL_TABLET | SUBLINGUAL | Status: DC | PRN

## 2014-12-22 MED ORDER — METFORMIN HCL 1000 MG PO TABS: 1000.0000 mg | ORAL_TABLET | Freq: Two times a day (BID) | ORAL | Status: DC

## 2014-12-22 MED ORDER — ATORVASTATIN CALCIUM 80 MG PO TABS: 80.0000 mg | ORAL_TABLET | Freq: Every day | ORAL | Status: DC

## 2014-12-22 MED ORDER — FUROSEMIDE 40 MG PO TABS
40.0000 mg | ORAL_TABLET | Freq: Every day | ORAL | Status: DC
  Administered 2014-12-22: 40 mg via ORAL

## 2014-12-22 MED ORDER — WARFARIN SODIUM 5 MG PO TABS: 5.0000 mg | ORAL_TABLET | Freq: Once | ORAL | Status: DC

## 2014-12-22 MED ORDER — LOSARTAN POTASSIUM 50 MG PO TABS: 25.0000 mg | ORAL_TABLET | Freq: Every day | ORAL | Status: DC

## 2014-12-22 MED ORDER — FUROSEMIDE 40 MG PO TABS: 20.0000 mg | ORAL_TABLET | Freq: Every day | ORAL | Status: DC

## 2014-12-22 NOTE — Progress Notes (Signed)
[place] for coumadin  Indication: chronic afib  Allergies  Allergen Reactions  . Penicillins Hives  . Amoxicillin Hives  . Morphine And Related     AMS, per son, she pulled out of her IV after morphine, however seems to be able to tolerate hydrocodone-acetaminophen    Patient Measurements: Wt= 87kg  Ht= 5' 11'' IBW: = 70.8kg  Vital Signs: Temp: 98 F (36.7 C) (12/24 0436) Temp Source: Oral (12/24 0436) BP: 120/81 mmHg (12/24 0958) Pulse Rate: 79 (12/24 0800)  Labs:  Recent Labs  12/19/14 1610 12/19/14 1941  12/20/14 0314 12/20/14 1225 12/20/14 2214 12/21/14 0500 12/21/14 1020 12/21/14 1355 12/22/14 0420  HGB 11.2*  --   --  11.9*  --   --  10.9*  --  10.8* 10.2*  HCT 34.8*  --   --  36.3  --   --  34.0*  --  33.2* 31.8*  PLT 224  --   --  235  --   --  228  --  208 194  LABPROT 24.5*  --   --  24.3*  --   --  20.2*  --   --  18.9*  INR 2.19*  --   --  2.16*  --   --  1.71*  --   --  1.57*  HEPARINUNFRC  --   --   < >  --  0.43 0.55 0.42  --   --   --   CREATININE 1.35*  --   --  1.52*  --   --   --  1.54* 1.47*  --   TROPONINI 0.78* 0.64*  --  0.47*  --   --   --   --   --   --   < > = values in this interval not displayed.  Estimated Creatinine Clearance: 43.8 mL/min (by C-G formula based on Cr of 1.47).  Assessment: 68 yo female here from [place] with NSTEMI. Patient was on coumadin PTA for chronic afib which was on hold for earlier cath- now resumed warfarin post-cath.   Home coumadin dose: 2.5mg /day except 5mg  on Fri.  INR this morning low at 1.57. CBC stable, no bleeding noted.  Goal of Therapy:  INR goal 2-3 Monitor platelets by anticoagulation protocol: Yes   Plan:  1. Noted cardiology planning to discharge today. Recommend Warfarin 5mg  tonight and then resume home dosing of 2.5mg  except 5mg  on Fridays starting 12/25. Recommend INR check on Tues 12/29.  Bird Swetz D. Demmi Sindt, PharmD, BCPS Clinical  Pharmacist Pager: [phone] 12/22/2014 10:38 AM

## 2014-12-22 NOTE — Progress Notes (Signed)
Patient need new sublingual nitroglycerine. I have given her new Rx of 25 tab 0.4mg  with 2 refill.   Hilbert Corrigan PA Pager: [phone]

## 2014-12-22 NOTE — Progress Notes (Signed)
Patient Profile: 68 year old Caucasian female with history of HTN, HLD, DM, CVA 2009, GERD and chronic atrial fibrillation on systemic anticoagulation transferred from [facility] for NSTEMI  Subjective: No chest pain, stable SOB.  Objective: Vital signs in last 24 hours: Temp:  [98 F (36.7 C)-98.1 F (36.7 C)] 98 F (36.7 C) (12/24 0436) Pulse Rate:  [63-123] 79 (12/24 0800) Resp:  [10-18] 15 (12/24 0800) BP: (105-131)/(53-100) 125/83 mmHg (12/24 0800) SpO2:  [89 %-100 %] 100 % (12/24 0800) Weight:  [195 lb 4.8 oz (88.587 kg)] 195 lb 4.8 oz (88.587 kg) (12/24 0436) Last BM Date: 12/17/14  Intake/Output from previous day: 12/23 0701 - 12/24 0700 In: 360 [P.O.:360] Out: 500 [Urine:500] Intake/Output this shift: Total I/O In: 120 [P.O.:120] Out: -   Medications  . [START ON 12/23/2014] aspirin EC  81 mg Oral Daily  . atorvastatin  80 mg Oral q1800  . buPROPion  150 mg Oral Daily  . digoxin  0.125 mg Oral QHS  . gabapentin  100 mg Oral QHS  . heparin  5,000 Units Subcutaneous 3 times per day  . insulin aspart  0-15 Units Subcutaneous TID WC  . insulin glargine  13 Units Subcutaneous QHS  . ipratropium-albuterol  3 mL Nebulization TID  . isosorbide mononitrate  30 mg Oral Daily  . magnesium oxide  400 mg Oral BID  . metoprolol  100 mg Oral BID  . sodium chloride  3 mL Intravenous Q12H  . Warfarin - Pharmacist Dosing Inpatient   Does not apply q1800   PE: General appearance: alert, cooperative, no distress and moderately obese Lungs: wheezing B/L Heart: irregularly irregular rhythm Extremities: no LEE Pulses: 2+ and symmetric Skin: warm and dry Neurologic: Grossly normal  Lab Results:   Recent Labs  12/21/14 0500 12/21/14 1355 12/22/14 0420  WBC 13.5* 12.1* 9.1  HGB 10.9* 10.8* 10.2*  HCT 34.0* 33.2* 31.8*  PLT 228 208 194   BMET  Recent Labs  12/19/14 1610 12/20/14 0314 12/21/14 1020 12/21/14 1355  NA 138 134* 135  --   K 4.5 4.6 4.3  --   CL  100 102 101  --   CO2 26 25 26   --   GLUCOSE 186* 280* 236*  --   BUN 30* 31* 32*  --   CREATININE 1.35* 1.52* 1.54* 1.47*  CALCIUM 9.8 8.9 8.9  --    PT/INR  Recent Labs  12/20/14 0314 12/21/14 0500 12/22/14 0420  LABPROT 24.3* 20.2* 18.9*  INR 2.16* 1.71* 1.57*   Cholesterol  Recent Labs  12/20/14 0314  CHOL 268*   Cardiac Panel (last 3 results)  Recent Labs  12/19/14 1610 12/19/14 1941 12/20/14 0314  TROPONINI 0.78* 0.64* 0.47*   Studies/Results: 2D echo - pending  CXR: 12/20/2014 COMPARISON: 12/18/2014  FINDINGS: Cardiac shadow is stable. Increased interstitial changes are noted with interstitial edema consistent with mild CHF. No focal confluent infiltrate is seen. No acute bony abnormality is noted.  IMPRESSION: Mild CHF  Echo: 12/20/2014 Left ventricle: Severe hypokinesis of septum apex mid and distal anterior and inferior walls Distribution may be consistant with Takatsubo. The cavity size was severely dilated. Wall thickness was increased in a pattern of mild LVH. Systolic function was severely reduced. The estimated ejection fraction was in the range of 20% to 25%. - Mitral valve: There was mild regurgitation. - Left atrium: The atrium was mildly dilated. - Right atrium: The atrium was mildly dilated. - Atrial septum: No defect or patent foramen ovale  was identified. - Pulmonary arteries: PA peak pressure: 43 mm Hg (S).  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  Hemodynamics:   RAP: 12/12 mmHg, RVP 39/8/10 mmHg  PAP: 41/19/28 mmHg; PCWP: 25/33/26 mmHg  LVP: 114/16/25 mmHg  AoP: 111/18/94  CO/CI: Fick 3.75 / 1.79; TD 3.43/1.64  Recommendations:  Medical management of moderate CAD  Gentle IV hydration post cath for Cr 1.5  Will  need afterload reduction & additional    Assessment/Plan  Principal Problem:   NSTEMI (non-ST elevated myocardial infarction) Active Problems:   Chronic atrial fibrillation   Diabetes mellitus   CKD (chronic kidney disease) stage 3, GFR 30-59 ml/min   Warfarin anticoagulation   Cardiomyopathy, awaiting cath possible Takatsubo, EF by echo 20-25%  1. NSTEMI: troponin peaked at 0.78 overnight, downward 0.64-->0.47. Cath showed moderate non-obstructive disease, and elevated filling pressures, we will restart Lasix 40 mg po but daily not PRN.  We will prescribe sl NTG PRN.  Continue ASA, metoprolol, atorvastatin.   2. Chronic Atrial Fibrillation: resting rate not adequately controlled. Increased metoprolol to 100 mg PO BID. HR now better controlled.   3. Chronic Coumadin Therapy: restart  4. Renal Insufficiency: SCr increased further to 1.52. Stable post cath, this will need to be rechecked the next week 5. DM: poorly controlled currently with blood glucose levels in the upper 200s. Hgb A1c pending. Metformin on hold for cath. Will use SS insulin.   Discharge home, restart Warfarin, follow up with her cardiologist with the next 10 days, check BMP at that visit.   Dorothy Spark 12/22/2014

## 2014-12-22 NOTE — Progress Notes (Signed)
RN went over extensive discharge instructions with the pt and the son. Both are agreeable to follow up appt on the 29th. Pt also agrees to pick up new medications along with dosage changes at her pharmacy. Etta Quill, RN

## 2014-12-22 NOTE — Discharge Instructions (Addendum)
Take LAsix everyday now, not just as needed. Take 5mg  of coumadin tonight and then resume normal dosing schedule. PLEASE FOLLOW UP WITH YOU PRIMARY CARDIOLOGIST AS SOON AS POSSIBLE AND GET YOUR INR CHECKED ON 12/27/14    Radial Site Care Refer to this sheet in the next few weeks. These instructions provide you with information on caring for yourself after your procedure. Your caregiver may also give you more specific instructions. Your treatment has been planned according to current medical practices, but problems sometimes occur. Call your caregiver if you have any problems or questions after your procedure. HOME CARE INSTRUCTIONS  You may shower the day after the procedure.Remove the bandage (dressing) and gently wash the site with plain soap and water.Gently pat the site dry.  Do not apply powder or lotion to the site.  Do not submerge the affected site in water for 3 to 5 days.  Inspect the site at least twice daily.  Do not flex or bend the affected arm for 24 hours.  No lifting over 5 pounds (2.3 kg) for 5 days after your procedure.  Do not drive home if you are discharged the same day of the procedure. Have someone else drive you.  You may drive 24 hours after the procedure unless otherwise instructed by your caregiver.  Do not operate machinery or power tools for 24 hours.  A responsible adult should be with you for the first 24 hours after you arrive home. What to expect:  Any bruising will usually fade within 1 to 2 weeks.  Blood that collects in the tissue (hematoma) may be painful to the touch. It should usually decrease in size and tenderness within 1 to 2 weeks. SEEK IMMEDIATE MEDICAL CARE IF:  You have unusual pain at the radial site.  You have redness, warmth, swelling, or pain at the radial site.  You have drainage (other than a small amount of blood on the dressing).  You have chills.  You have a fever or persistent symptoms for more than 72 hours.  You  have a fever and your symptoms suddenly get worse.  Your arm becomes pale, cool, tingly, or numb.  You have heavy bleeding from the site. Hold pressure on the site. Document Released: 01/18/2011 Document Revised: 03/09/2012 Document Reviewed: 01/18/2011 [facility] Patient Information 2015 [place], [place]. This information is not intended to replace advice given to you by your health care provider. Make sure you discuss any questions you have with your health care provider.

## 2014-12-22 NOTE — Discharge Summary (Signed)
Discharge Summary   Patient ID: Angel Allen MRN: [id number], DOB/AGE: 1946/12/14 68 y.o. Admit date: 12/19/2014 D/C date:     12/22/2014  Primary Cardiologist: Dr. Tyrone Sage at [facility]  Principal Problem:   NSTEMI (non-ST elevated myocardial infarction) Active Problems:   Chronic atrial fibrillation   Diabetes mellitus   CKD (chronic kidney disease) stage 3, GFR 30-59 ml/min   Warfarin anticoagulation   Cardiomyopathy, awaiting cath possible Takatsubo, EF by echo 20-25%   Shortness of breath    Admission Dates: 12/19/14-12/22/14 Discharge Diagnosis: NSTEMI   HPI: Angel Allen is a 68 y.o. female with a history of HTN, HLD, DM, CVA 2009, GERD and chronic atrial fibrillation on systemic anticoagulation transferred from [facility] for NSTEMI   The patient lives near [place]. She has a cardiologist there. She was visiting her son for the holidays in [place]. She developed chest pain yesterday and received her care at [facility].  She states she has known atrial fibrillation for many years, controlled on beta blocker, digoxin, and Coumadin. She recently had a Coumadin check at her primary cardiologist office. The last cardiac catheterization she had was 2-3 years ago at [facility]. She was told there was no significant blockage and her heart problem was mainly due to stress. Patient has been doing well at home. She denies being very active however is able to do most of her laundry and day-to-day activity without exertional chest discomfort. She denies any recent fever, chill, cough, lower extremity edema or paroxysmal nocturnal dyspnea. She was sitting on her couch around 9:30pm on the night of 12/18/2014 when she had sudden onset of right-sided chest pain radiating to her back. She denies any shortness of breath, diaphoresis or dizziness. She sought medical attention at [facility]. Chest x-ray was negative, CT of the chest showed minimal right-sided atelectasis,  lungs otherwise clear. There was no evidence of aortic dissection. The report does not specifically comment on the presence or absence of pulmonary emboli. A disc with the study has been sent to [place]. Troponin was normal at 0.03. EKG showed atrial fibrillation with heart rate 91. She was given pain medication and her chest discomfort quickly went away. Laboratory finding include sodium 134, potassium 3.9, BUN 12.9, creatinine 1.66, magnesium 1.4, glucose 317, d-dimer negative 0.27, hemoglobin 11.5, white blood cell 9.2, platelet 212, Patient was eventually discharged to home. Around 5:30 AM this on the day of admission, her chest pain returned prompting her to seek medical attention at [facility] again. CP again went again with pain medication. Repeat troponin was elevated at 0.28. EKG repeated which continued to show atrial fibrillation, however T-wave inversion in anterolateral lead was noted which is new. Patient was transferred to [facility] as an NSTEMI.   Hospital Course  NSTEMI: troponin peaked at 0.78 overnight, downward 0.64-->0.47. -- Cath showed moderate non-obstructive disease, and elevated filling pressures, we will restart Lasix 40 mg po but daily not PRN.  -- Continue ASA, metoprolol, atorvastatin.  -- We will prescribe sl NTG PRN.   Chronic Atrial Fibrillation: resting rate was not adequately controlled. -- Metoprolol increased to 100 mg PO BID and HR now better controlled. Continue digoxin and coumadin  Chronic Coumadin Therapy: Pharmacy recommended Warfarin 5mg  tonight and then resume home dosing of 2.5mg  except 5mg  on Fridays starting 12/25. -- INR 1.5 today  -- Recommend INR check on Tues 12/29.  Renal Insufficiency: SCr increased further to 1.52. Stable post cath, this will need to be  rechecked the next week. -- Discharge creat 1.47. Will hold her ARB for the next couple days  DM: poorly controlled currently with blood glucose levels in the upper 200s. Hgb A1c  7.8. Metformin on hold for cath. Will use SS insulin -- She can resume metformin 12/24/14.  The patient has had an uncomplicated hospital course and is recovering well. The radial catheter site is stable. She has been seen by Dr. Meda Allen today and deemed ready for discharge home. Discharge medications are listed below. Follow-up appointments were not able to be scheduled due to office closings for the holidays. Her primary cardiologist is Dr. Tyrone Sage at [facility] and she has be instructed to see him within 10 days. She also needs a BMET within 1 week to check her kidney function. She also has been told she needs a coumadin clinic appointment 12/27/14. She will be provided a copy of her discharge summary to present to her primary cardiologist.    Discharge Vitals: Blood pressure 120/81, pulse 79, temperature 98 F (36.7 C), temperature source Oral, resp. rate 15, height 5\' 11"  (1.803 m), weight 195 lb 4.8 oz (88.587 kg), SpO2 100 %.  Labs: Lab Results  Component Value Date   WBC 9.1 12/22/2014   HGB 10.2* 12/22/2014   HCT 31.8* 12/22/2014   MCV 88.8 12/22/2014   PLT 194 12/22/2014    Recent Labs Lab 12/19/14 1610  12/21/14 1020 12/21/14 1355  NA 138  < > 135  --   K 4.5  < > 4.3  --   CL 100  < > 101  --   CO2 26  < > 26  --   BUN 30*  < > 32*  --   CREATININE 1.35*  < > 1.54* 1.47*  CALCIUM 9.8  < > 8.9  --   PROT 7.1  --   --   --   BILITOT 0.4  --   --   --   ALKPHOS 78  --   --   --   ALT 11  --   --   --   AST 15  --   --   --   GLUCOSE 186*  < > 236*  --   < > = values in this interval not displayed.  Recent Labs  12/19/14 1610 12/19/14 1941 12/20/14 0314  TROPONINI 0.78* 0.64* 0.47*   Lab Results  Component Value Date   CHOL 268* 12/20/2014   HDL 45 12/20/2014   LDLCALC 143* 12/20/2014   TRIG 400* 12/20/2014     Diagnostic Studies/Procedures   Dg Chest Port 1 View  12/20/2014   CLINICAL DATA:  Shortness of breath  EXAM: PORTABLE CHEST - 1 VIEW   COMPARISON:  12/18/2014  FINDINGS: Cardiac shadow is stable. Increased interstitial changes are noted with interstitial edema consistent with mild CHF. No focal confluent infiltrate is seen. No acute bony abnormality is noted.  IMPRESSION: Mild CHF   Electronically Signed   By: Inez Catalina M.D.   On: 12/20/2014 07:58   Echo: 12/20/2014 Left ventricle: Severe hypokinesis of septum apex mid and distal anterior and inferior walls Distribution may be consistant with Takatsubo. The cavity size was severely dilated. Wall thickness was increased in a pattern of mild LVH. Systolic function was severely reduced. The estimated ejection fraction was in the range of 20% to 25%. - Mitral valve: There was mild regurgitation. - Left atrium: The atrium was mildly dilated. - Right atrium: The atrium was mildly  dilated. - Atrial septum: No defect or patent foramen ovale was identified. - Pulmonary arteries: PA peak pressure: 43 mm Hg (S).    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     aspirin 81 MG EC tablet  Take 1 tablet (81 mg total) by mouth daily.  Start taking on:  12/23/2014     atorvastatin 80 MG tablet  Commonly known as:  LIPITOR  Take 1 tablet (80 mg  total) by mouth daily at 6 PM.     buPROPion 150 MG 24 hr tablet  Commonly known as:  WELLBUTRIN XL  Take 150 mg by mouth daily.     digoxin 0.125 MG tablet  Commonly known as:  LANOXIN  Take 0.125 mg by mouth at bedtime.     furosemide 40 MG tablet  Commonly known as:  LASIX  Take 0.5 tablets (20 mg total) by mouth daily.     gabapentin 100 MG capsule  Commonly known as:  NEURONTIN  Take 100 mg by mouth at bedtime.     isosorbide mononitrate 30 MG 24 hr tablet  Commonly known as:  IMDUR  Take 30 mg by mouth daily.     losartan 50 MG tablet  Commonly known as:  COZAAR  Take 0.5 tablets (25 mg total) by mouth at bedtime.  Start taking on:  12/24/2014     magnesium oxide 400 MG tablet  Commonly known as:  MAG-OX  Take 400 mg by mouth 2 (two) times daily.     metFORMIN 1000 MG tablet  Commonly known as:  GLUCOPHAGE  Take 1 tablet (1,000 mg total) by mouth 2 (two) times daily with a meal.  Start taking on:  12/24/2014     metoprolol 50 MG tablet  Commonly known as:  LOPRESSOR  Take 50 mg by mouth 2 (two) times daily.     nitroGLYCERIN 0.4 MG SL tablet  Commonly known as:  NITROSTAT  Place 1 tablet (0.4 mg total) under the tongue every 5 (five) minutes x 3 doses as needed for chest pain.     warfarin 2.5 MG tablet  Commonly known as:  COUMADIN  Take 2.5-5 mg by mouth daily. Take 2.5mg  po daily except take 5mg  on Fridays        Disposition   The patient will be discharged in stable condition to home.  Follow-up Information    Follow up with Dr. Tyrone Sage at [facility].   Why:  Please see him with in 10 days, lab work to check your kidney function and coumadin clinic appointment 12/27/14        Duration of Discharge Encounter: Greater than 30 minutes including physician and PA time.  Signed,  Angelena Form R PA-C 12/22/2014, 11:13 AM

## 2016-01-16 IMAGING — CR DG CHEST 1V PORT
1 series · 1 of 1 positions shown · non-contrast
Comparison: 12/18/2014

CLINICAL DATA: Shortness of breath

EXAM:
PORTABLE CHEST - 1 VIEW

[AP]
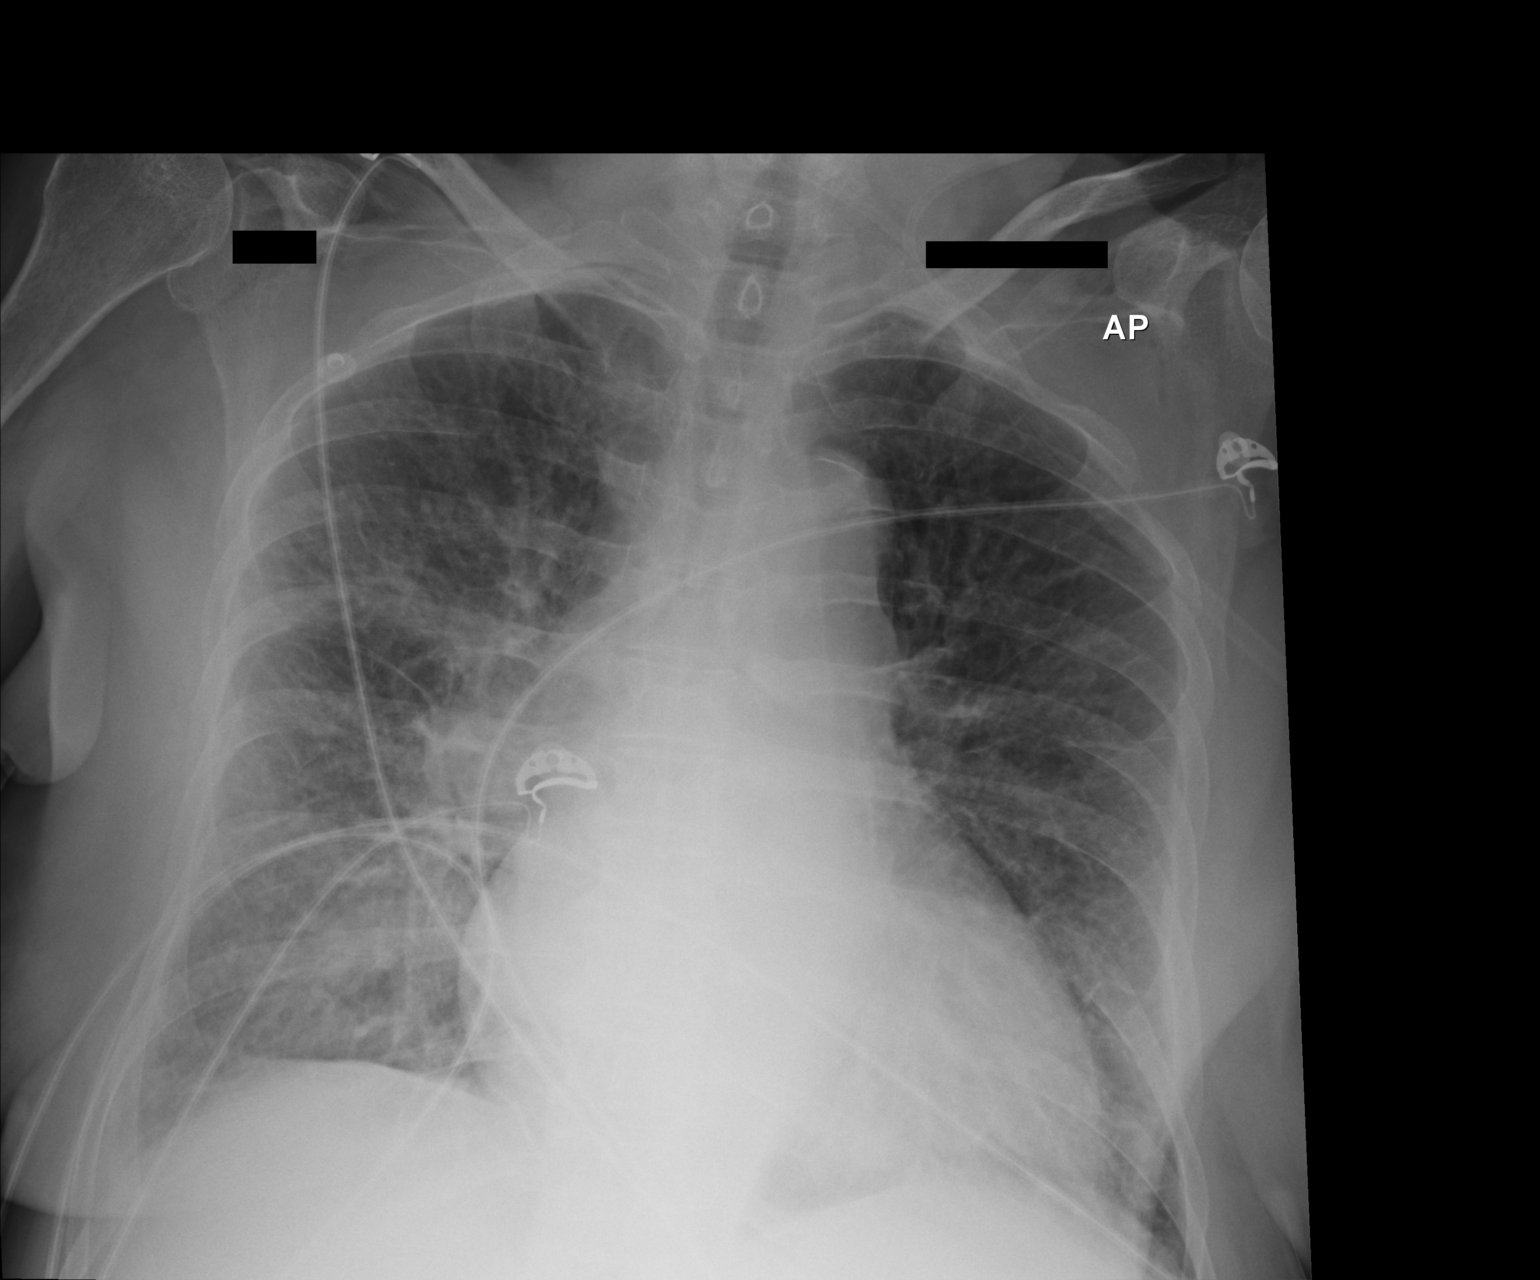

[1 of 1 positions shown; findings below may reference images not displayed]

FINDINGS: Cardiac shadow is stable. Increased interstitial changes are noted
with interstitial edema consistent with mild CHF. No focal confluent
infiltrate is seen. No acute bony abnormality is noted.
IMPRESSION: Mild CHF

## 2020-04-14 ENCOUNTER — Other Ambulatory Visit (HOSPITAL_COMMUNITY): Payer: Medicare Other | Admitting: Internal Medicine

## 2020-04-14 ENCOUNTER — Encounter: Payer: Self-pay | Admitting: Internal Medicine

## 2020-04-14 DIAGNOSIS — I482 Chronic atrial fibrillation, unspecified: Secondary | ICD-10-CM | POA: Insufficient documentation

## 2020-04-14 DIAGNOSIS — C320 Malignant neoplasm of glottis: Secondary | ICD-10-CM

## 2020-04-14 DIAGNOSIS — J9621 Acute and chronic respiratory failure with hypoxia: Secondary | ICD-10-CM | POA: Diagnosis not present

## 2020-04-14 DIAGNOSIS — N183 Chronic kidney disease, stage 3 unspecified: Secondary | ICD-10-CM | POA: Insufficient documentation

## 2020-04-14 DIAGNOSIS — I429 Cardiomyopathy, unspecified: Secondary | ICD-10-CM

## 2020-04-14 DIAGNOSIS — I5020 Unspecified systolic (congestive) heart failure: Secondary | ICD-10-CM

## 2020-04-14 NOTE — Progress Notes (Signed)
[place]  Date of Service: 04/14/2020  PULMONARY CONSULT   Angel Allen  [id number]  DOB: Sep 15, 1946     Referring Physician: Deanne Coffer, MD  HPI: Angel Allen is a 74 y.o. female seen for Acute on Chronic Respiratory Failure.  Patient has multiple medical problems currently with a history of cardiomyopathy chronic atrial fibrillation diabetes morbid obesity stroke hypertension chronic kidney disease came into the hospital because of increasing shortness of breath increased work of breathing.  Patient at that time was evaluated apparently after collapsing her vehicle.  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  Patient was felt not to be an ideal candidate for chemo and also patient chose to decline radiation.  Patient also requested a DNR status.  Review of Systems:  ROS performed and is unremarkable other than noted above.  Past Medical History:  Diagnosis Date  . Atrial fibrillation   . Cardiomyopathy, awaiting cath possible [facility] 12/21/2014  . Chronic atrial fibrillation    on coumadin  . CVA (cerebral infarction) 2009  . GERD (gastroesophageal reflux disease)   . Hyperlipidemia   . Hypertension   . Restless leg syndrome   . Type II diabetes mellitus     Past Surgical History:  Procedure Laterality Date  . CARDIAC CATHETERIZATION  2012-2013   at OSH, no significant blockage, felt 2/2 stress  . LEFT AND RIGHT HEART CATHETERIZATION WITH CORONARY ANGIOGRAM N/A 12/21/2014   Procedure: LEFT AND RIGHT HEART CATHETERIZATION WITH CORONARY ANGIOGRAM;  Surgeon: Leonie Man, MD;  Location: [facility] CATH LAB;  Service: Cardiovascular;  Laterality: N/A;    Social History:    reports that she has quit smoking. She does not have any smokeless tobacco history on file. She reports that she does not drink alcohol or use drugs.  Family  History: Non-Contributory to the present illness  Allergies  Reviewed on the [facility]  Medications: Reviewed on Rounds  Physical Exam:  Vitals: Temperature 96.9 pulse 72 respiratory 20 blood pressure is 116/75 saturations 97%  Ventilator Settings on T collar FiO2 is room air  . General: Comfortable at this time . Eyes: Grossly normal lids, irises & conjunctiva . ENT: grossly tongue is normal . Neck: no obvious mass . Cardiovascular: S1-S2 normal no gallop or rub . Respiratory: No rhonchi no rales are noted . Abdomen: Soft and nontender . Skin: no rash seen on limited exam . Musculoskeletal: not rigid . Psychiatric:unable to assess . Neurologic: no seizure no involuntary movements         Labs on Admission:  Sodium 134 potassium 4.0 BUN is 27 creatinine 1.1 White count 8.4 hemoglobin 11.5 hematocrit 34.8 platelet count 237  Radiological Exams on Admission: No recent chest x-ray noted  Assessment/Plan Patient Active Problem List   Diagnosis Date Noted  . Acute on chronic respiratory failure with hypoxia ([place])   . Cancer of glottis ([place])   . Systolic congestive heart failure with reduced left ventricular function, NYHA class 3 ([place])   . Chronic atrial fibrillation ([place])   . Chronic kidney disease, stage III (moderate)      1. Acute on chronic respiratory failure with hypoxia the patient is on T collar on room air right now she is not a candidate for any type of radiation therapy or chemotherapy at this point.  Patient also has made herself a DNR.  She will likely need to keep the  tracheostomy in place secondary to the tumor.  From a pulmonary perspective really not much more to add. 2. Congestive heart failure systolic with EF reduction supportive care monitor fluid status 3. Chronic atrial fibrillation rate controlled 4. Chronic kidney disease monitor labs 5. Cancer of the glottis supportive care  I have personally seen and evaluated the patient, evaluated laboratory and  imaging results, formulated the assessment and plan and placed orders. The Patient requires high complexity decision making with multiple systems involvement.  Case was discussed on Rounds with the Respiratory Therapy Staff Time Spent 62minutes  Allyne Gee, MD [facility]

## 2020-06-17 ENCOUNTER — Emergency Department (HOSPITAL_COMMUNITY)
Admission: EM | Admit: 2020-06-17 | Discharge: 2020-06-17 | Disposition: A | Discharge status: Skilled Nursing Facility | Payer: Medicare Other | Attending: Emergency Medicine | Admitting: Emergency Medicine

## 2020-06-17 ENCOUNTER — Other Ambulatory Visit: Payer: Self-pay

## 2020-06-17 ENCOUNTER — Encounter (HOSPITAL_COMMUNITY): Payer: Self-pay | Admitting: *Deleted

## 2020-06-17 DIAGNOSIS — Z7901 Long term (current) use of anticoagulants: Secondary | ICD-10-CM | POA: Insufficient documentation

## 2020-06-17 DIAGNOSIS — J9503 Malfunction of tracheostomy stoma: Secondary | ICD-10-CM | POA: Insufficient documentation

## 2020-06-17 DIAGNOSIS — Z886 Allergy status to analgesic agent status: Secondary | ICD-10-CM | POA: Insufficient documentation

## 2020-06-17 DIAGNOSIS — I130 Hypertensive heart and chronic kidney disease with heart failure and stage 1 through stage 4 chronic kidney disease, or unspecified chronic kidney disease: Secondary | ICD-10-CM | POA: Diagnosis not present

## 2020-06-17 DIAGNOSIS — Z87891 Personal history of nicotine dependence: Secondary | ICD-10-CM | POA: Insufficient documentation

## 2020-06-17 DIAGNOSIS — I509 Heart failure, unspecified: Secondary | ICD-10-CM | POA: Diagnosis not present

## 2020-06-17 DIAGNOSIS — N183 Chronic kidney disease, stage 3 unspecified: Secondary | ICD-10-CM | POA: Diagnosis not present

## 2020-06-17 DIAGNOSIS — Z880 Allergy status to penicillin: Secondary | ICD-10-CM | POA: Insufficient documentation

## 2020-06-17 DIAGNOSIS — Z79899 Other long term (current) drug therapy: Secondary | ICD-10-CM | POA: Insufficient documentation

## 2020-06-17 DIAGNOSIS — E1122 Type 2 diabetes mellitus with diabetic chronic kidney disease: Secondary | ICD-10-CM | POA: Insufficient documentation

## 2020-06-17 DIAGNOSIS — Z7982 Long term (current) use of aspirin: Secondary | ICD-10-CM | POA: Insufficient documentation

## 2020-06-17 DIAGNOSIS — J9500 Unspecified tracheostomy complication: Secondary | ICD-10-CM

## 2020-06-17 NOTE — ED Notes (Signed)
Report given to Wilmer Floor at [facility]. EMS called for transfer.

## 2020-06-17 NOTE — ED Triage Notes (Signed)
Patient comes to the ED from [facility] after staff discovered her trach has come out.  Patient is alert, oriented and in no distress.  Staff made attempt to replace with no success.

## 2020-06-17 NOTE — ED Provider Notes (Signed)
[place] Provider Note   CSN: [id number] Arrival date & time: 06/17/20  0753     History Chief Complaint  Patient presents with  . tracheostomy    needs replaced    Angel Allen is a 74 y.o. female history of laryngeal cancer status post tracheostomy (changed to 6-0 cuffless Shiley in April 2021 at [facility]), DNR/DNI, not a candidate for chemo or radiation therapy per outpatient records, presenting from skilled nursing facility with concern for dislodged tracheostomy.  Appears her tracheostomy tube fell out or was accidentally pulled out.  It is unclear when this happened per EMS report.  Patient herself is unsure of when this happened.  Staff made he attempts to replace the tube were not able to do so.  Per her records the patient is typically on trach collar on room air.  Patient awake but with no focal complaints on arrival, has difficulty speaking/being understood here.  HPI     Past Medical History:  Diagnosis Date  . Acute on chronic respiratory failure with hypoxia ([place])   . Acute on chronic respiratory failure with hypoxia ([place])   . Atrial fibrillation ([place])   . 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 Cardiomyopathy, awaiting cath possible [facility] 12/21/2014  . Chronic atrial fibrillation ([facility])    on coumadin  . Chronic atrial fibrillation ([place])   . Chronic kidney disease, stage III (moderate)   . CVA (cerebral infarction) 2009  . GERD (gastroesophageal reflux disease)   . Hyperlipidemia   . Hypertension   . Restless leg syndrome   . Systolic congestive heart failure with reduced left ventricular function, NYHA class 3 ([place])   . Type II diabetes mellitus [facility])     Patient Active Problem List   Diagnosis Date Noted  . Acute on chronic respiratory failure with hypoxia ([place])   . 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 Systolic congestive heart failure with reduced left ventricular function, NYHA class 3 ([place])   . Chronic atrial fibrillation ([place])   . Chronic kidney  disease, stage III (moderate)     Past Surgical History:  Procedure Laterality Date  . CARDIAC CATHETERIZATION  2012-2013   at OSH, no significant blockage, felt 2/2 stress  . LEFT AND RIGHT HEART CATHETERIZATION WITH CORONARY ANGIOGRAM N/A 12/21/2014   Procedure: LEFT AND RIGHT HEART CATHETERIZATION WITH CORONARY ANGIOGRAM;  Surgeon: Leonie Man, MD;  Location: [facility] CATH LAB;  Service: Cardiovascular;  Laterality: N/A;     OB History   No obstetric history on file.     Family History  Problem Relation Age of Onset  . Alzheimer's disease Mother   . CAD Neg Hx     Social History   Tobacco Use  . Smoking status: Former Research scientist (life sciences)  . Smokeless tobacco: Never Used  . Tobacco comment: quit in 1980s  Substance Use Topics  . Alcohol use: No    Alcohol/week: 0.0 standard drinks  . Drug use: No    Home Medications Prior to Admission medications   Medication Sig Start Date End Date Taking? Authorizing Provider  acetaminophen (TYLENOL) 325 MG tablet Take 650 mg by mouth every 6 (six) hours as needed for mild pain or moderate pain.    [provider]  aspirin EC 81 MG EC tablet Take 1 tablet (81 mg total) by mouth daily. 12/23/14   Eileen Stanford, PA-C  atorvastatin (LIPITOR) 80 MG tablet Take 1 tablet (80 mg total) by mouth daily at 6 PM. 12/22/14  Eileen Stanford, PA-C  buPROPion (WELLBUTRIN XL) 150 MG 24 hr tablet Take 150 mg by mouth daily.    [provider]  digoxin (LANOXIN) 0.125 MG tablet Take 0.125 mg by mouth at bedtime.    [provider]  furosemide (LASIX) 40 MG tablet Take 0.5 tablets (20 mg total) by mouth daily. 12/22/14   Eileen Stanford, PA-C  gabapentin (NEURONTIN) 100 MG capsule Take 100 mg by mouth at bedtime.    [provider]  isosorbide mononitrate (IMDUR) 30 MG 24 hr tablet Take 30 mg by mouth daily.    [provider]  losartan (COZAAR) 50 MG tablet Take 0.5 tablets (25 mg total) by mouth at bedtime.  12/24/14   Eileen Stanford, PA-C  magnesium oxide (MAG-OX) 400 MG tablet Take 400 mg by mouth 2 (two) times daily.    [provider]  metFORMIN (GLUCOPHAGE) 1000 MG tablet Take 1 tablet (1,000 mg total) by mouth 2 (two) times daily with a meal. 12/24/14   Eileen Stanford, PA-C  metoprolol (LOPRESSOR) 50 MG tablet Take 50 mg by mouth 2 (two) times daily.    [provider]  nitroGLYCERIN (NITROSTAT) 0.4 MG SL tablet Place 1 tablet (0.4 mg total) under the tongue every 5 (five) minutes as needed for chest pain. 12/22/14   Almyra Deforest, PA  warfarin (COUMADIN) 2.5 MG tablet Take 2.5-5 mg by mouth daily. Take 2.5mg  po daily except take 5mg  on Fridays    [provider]    Allergies    Penicillins, Amoxicillin, and Morphine and related  Review of Systems   Review of Systems  Unable to perform ROS: Patient nonverbal (level 5 caveat)    Physical Exam Updated Vital Signs BP (!) 132/93   Pulse 65   Temp 97.9 F (36.6 C) (Oral)   Resp 17   Ht 5\' 11"  (1.803 m)   Wt 104.3 kg   SpO2 99%   BMI 32.08 kg/m   Physical Exam Vitals and nursing note reviewed.  Constitutional:      General: She is not in acute distress.    Appearance: She is well-developed. She is obese.  HENT:     Head: Normocephalic and atraumatic.     Comments: Tracheostomy stoma site with friable tissue, no active bleeding, hole is patent (sucking air movement noted) Eyes:     Conjunctiva/sclera: Conjunctivae normal.     Pupils: Pupils are equal, round, and reactive to light.  Cardiovascular:     Rate and Rhythm: Normal rate and regular rhythm.     Pulses: Normal pulses.  Pulmonary:     Effort: Pulmonary effort is normal. No respiratory distress.     Breath sounds: Normal breath sounds.  Musculoskeletal:     Cervical back: Neck supple.  Skin:    General: Skin is warm and dry.  Neurological:     Mental Status: She is alert.  Psychiatric:        Mood and Affect: Mood normal.         Behavior: Behavior normal.     ED Results / Procedures / Treatments   Labs (all labs ordered are listed, but only abnormal results are displayed) Labs Reviewed - No data to display  EKG None  Radiology No results found.  Procedures TRACHEOSTOMY REPLACEMENT  Date/Time: 06/17/2020 8:34 AM Performed by: Wyvonnia Dusky, MD Authorized by: Wyvonnia Dusky, MD  Indications: became dislodged Local anesthesia used: no  Anesthesia: Local anesthesia used: no  Sedation: Patient sedated: no  Preparation: Patient was prepped and draped in the usual sterile fashion. Tube type: single cannula Tube cuff: cuffless Tube size: 4.0 mm Seldinger technique: Seldinger technique used Patient tolerance: patient tolerated the procedure well with no immediate complications    (including critical care time)  Medications Ordered in ED Medications - No data to display  ED Course  I have reviewed the triage vital signs and the nursing notes.  Pertinent labs & imaging results that were available during my care of the patient were reviewed by me and considered in my medical decision making (see chart for details).  75 female presented emergency department with tracheostomy tube dislodgment.  She was wearing a 6-0 Shiley cuffless, last changed 2 months ago, but has had the tracheostomy site longer.  I expect it is a matured site.  No evidence of Trachoinnominate fistula.  She is maintaining her sats on room air.  I did make 3 attempts to replace the original cuffless Shiley through the ostomy site, but was unable to do so due to narrowing and stenosis of the site.    We had a 4-0 cuffless shiley available in building as the next smallest alternative.  After gently dilating her ostomy hole with the 4-0 dilated, I was able to insert this tube with steady pressure and only minor difficulty.  No bleeding.  Oxygen sats maintained.  Anticipate discharge back to her facility with instructions for them  to contact her surgeon/ ENT doctor at [facility] on Monday to discuss rapid follow up evaluation.    Clinical Course as of Jun 17 1710  Sat Jun 17, 2020  0919 BP has been labile but I suspect there are multiple erroneous reads from the automated cuff, last BP with correct cuff size is 104/81.  95% O2 on room air.  Patient resting comfortably.  Will discharge   [MT]    Clinical Course User Index [MT] Hester Forget, Carola Rhine, MD    Final Clinical Impression(s) / ED Diagnoses Final diagnoses:  Complication of tracheostomy tube [facility])    Rx / DC Orders ED Discharge Orders    None       Wyvonnia Dusky, MD 06/17/20 1711

## 2020-06-17 NOTE — ED Notes (Signed)
Pt hands are cold , hands warmed and oxygen went up to 100% RA.

## 2020-06-17 NOTE — Discharge Instructions (Addendum)
Angel Allen's tracheostomy tube (cuffless Shiley 6-0) had fallen out, and we were not able to replace this same sized tube in her ostomy site.  We were able to place a Shiley 4-0 cuffless tube after some dilation and effort.  She is satting 100% on room air with this.  I felt this was a stable option for the weekend.  Please reach out today to her surgeon at [facility] who did the most recent changeover in April 2021.  Contact information is below.  They will likely need to re-evaluate her in the office and may decide to dilate and place a larger tube size, or leave this one in place.  Date Type Department Care Team Description  04/03/2020 Surgery [facility]   [address]   [place], [place] [address]   [phone]   Feliz Beam, MD   [facility] [place], [place] [address]   [phone]   [phone] (Fax)   TRACHEOSTOMY, FENESTRATION PROCEDURE WITH SKIN FLAPS

## 2020-06-17 NOTE — ED Notes (Signed)
Called EMS for transport back to [facility]

## 2020-06-25 ENCOUNTER — Encounter (HOSPITAL_COMMUNITY): Payer: Self-pay | Admitting: Emergency Medicine

## 2020-06-25 ENCOUNTER — Emergency Department (HOSPITAL_COMMUNITY)
Admission: EM | Admit: 2020-06-25 | Discharge: 2020-06-25 | Disposition: A | Discharge status: Home / Self Care | Payer: Medicare Other | Attending: Emergency Medicine | Admitting: Emergency Medicine

## 2020-06-25 ENCOUNTER — Other Ambulatory Visit: Payer: Self-pay

## 2020-06-25 DIAGNOSIS — Z7982 Long term (current) use of aspirin: Secondary | ICD-10-CM | POA: Insufficient documentation

## 2020-06-25 DIAGNOSIS — J9503 Malfunction of tracheostomy stoma: Secondary | ICD-10-CM | POA: Insufficient documentation

## 2020-06-25 DIAGNOSIS — Z87891 Personal history of nicotine dependence: Secondary | ICD-10-CM | POA: Diagnosis not present

## 2020-06-25 DIAGNOSIS — I10 Essential (primary) hypertension: Secondary | ICD-10-CM | POA: Insufficient documentation

## 2020-06-25 DIAGNOSIS — J80 Acute respiratory distress syndrome: Secondary | ICD-10-CM | POA: Diagnosis present

## 2020-06-25 DIAGNOSIS — E119 Type 2 diabetes mellitus without complications: Secondary | ICD-10-CM | POA: Insufficient documentation

## 2020-06-25 NOTE — ED Notes (Signed)
Spoke with [employer] about ETA. According to dispatch it will be another 2 to 3 hours. RN notified.

## 2020-06-25 NOTE — Progress Notes (Signed)
Suctioned patient through #4 cuffless Shiley trach and obtained moderate amount of thick tan secretions. No adverse reaction noted. Dressing changed and applied behind flange. Post suctioning her SpO2 98% RA.

## 2020-06-25 NOTE — Progress Notes (Signed)
Reinserted [employer]ident shiley 4.un65r cuff less trach.

## 2020-06-25 NOTE — ED Notes (Signed)
Report called to staff at [facility].

## 2020-06-25 NOTE — ED Triage Notes (Signed)
Pt brought in by EMS from [facility] after she pulled her trach out. Staff made attempt to replace but were unsuccessful.

## 2020-06-25 NOTE — ED Notes (Signed)
[facility] [employer] called at this time to set up transportation back to facility.

## 2020-06-25 NOTE — ED Notes (Signed)
[facility] here for transport.

## 2020-06-25 NOTE — ED Provider Notes (Signed)
[facility] EMERGENCY DEPARTMENT Provider Note   CSN: [id number] Arrival date & time: 06/25/20  8250   Time seen 3:10 AM  History Chief Complaint  Patient presents with  . Respiratory Distress    Angel Allen is a 74 y.o. female.  HPI   Per EMS patient was found at her nursing facility and her trach could come out.  They tried to put it back in without success but they did put the outer cannula back in the tracheostomy hole.  Patient was seen in the ED on June 19 when her trach came out.  Patient is unable to verbally speak because of her trach.  Patient is awake and alert, indicates she feels fine.  PCP Deanne Coffer, MD   Patient is DO NOT RESUSCITATE   Past Medical History:  Diagnosis Date  . Acute on chronic respiratory failure with hypoxia ([place])   . Acute on chronic respiratory failure with hypoxia ([place])   . Atrial fibrillation ([place])   . 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 Cardiomyopathy, awaiting cath possible [facility] 12/21/2014  . Chronic atrial fibrillation (HCC)    on coumadin  . Chronic atrial fibrillation ([place])   . Chronic kidney disease, stage III (moderate)   . CVA (cerebral infarction) 2009  . GERD (gastroesophageal reflux disease)   . Hyperlipidemia   . Hypertension   . Restless leg syndrome   . 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 Type II diabetes mellitus [facility])     Patient Active Problem List   Diagnosis Date Noted  . Acute on chronic respiratory failure with hypoxia ([place])   . 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 Systolic congestive heart failure with reduced left ventricular function, NYHA class 3 ([place])   . Chronic atrial fibrillation ([place])   . Chronic kidney disease, stage III (moderate)     Past Surgical History:  Procedure Laterality Date  . CARDIAC CATHETERIZATION  2012-2013   at OSH, no significant blockage, felt 2/2 stress  . LEFT AND RIGHT HEART CATHETERIZATION WITH CORONARY ANGIOGRAM N/A  12/21/2014   Procedure: LEFT AND RIGHT HEART CATHETERIZATION WITH CORONARY ANGIOGRAM;  Surgeon: Leonie Man, MD;  Location: [facility] CATH LAB;  Service: Cardiovascular;  Laterality: N/A;     OB History   No obstetric history on file.     Family History  Problem Relation Age of Onset  . Alzheimer's disease Mother   . CAD Neg Hx     Social History   Tobacco Use  . Smoking status: Former Research scientist (life sciences)  . Smokeless tobacco: Never Used  . Tobacco comment: quit in 1980s  Substance Use Topics  . Alcohol use: No    Alcohol/week: 0.0 standard drinks  . Drug use: No  Lives in a facility  Home Medications Prior to Admission medications   Medication Sig Start Date End Date Taking? Authorizing Provider  acetaminophen (TYLENOL) 325 MG tablet Take 650 mg by mouth every 6 (six) hours as needed for mild pain or moderate pain.    [provider]  aspirin EC 81 MG EC tablet Take 1 tablet (81 mg total) by mouth daily. 12/23/14   Eileen Stanford, PA-C  atorvastatin (LIPITOR) 80 MG tablet Take 1 tablet (80 mg total) by mouth daily at 6 PM. 12/22/14   Eileen Stanford, PA-C  buPROPion (WELLBUTRIN XL) 150 MG 24 hr tablet Take 150 mg by mouth daily.    [provider]  digoxin (LANOXIN) 0.125 MG tablet Take 0.125 mg by mouth at bedtime.    [provider]  furosemide (LASIX) 40 MG tablet Take 0.5 tablets (20 mg total) by mouth daily. 12/22/14   Eileen Stanford, PA-C  gabapentin (NEURONTIN) 100 MG capsule Take 100 mg by mouth at bedtime.    [provider]  isosorbide mononitrate (IMDUR) 30 MG 24 hr tablet Take 30 mg by mouth daily.    [provider]  losartan (COZAAR) 50 MG tablet Take 0.5 tablets (25 mg total) by mouth at bedtime. 12/24/14   Eileen Stanford, PA-C  magnesium oxide (MAG-OX) 400 MG tablet Take 400 mg by mouth 2 (two) times daily.    [provider]  metFORMIN (GLUCOPHAGE) 1000 MG tablet Take 1 tablet (1,000 mg total) by mouth  2 (two) times daily with a meal. 12/24/14   Eileen Stanford, PA-C  metoprolol (LOPRESSOR) 50 MG tablet Take 50 mg by mouth 2 (two) times daily.    [provider]  nitroGLYCERIN (NITROSTAT) 0.4 MG SL tablet Place 1 tablet (0.4 mg total) under the tongue every 5 (five) minutes as needed for chest pain. 12/22/14   Almyra Deforest, PA  warfarin (COUMADIN) 2.5 MG tablet Take 2.5-5 mg by mouth daily. Take 2.5mg  po daily except take 5mg  on Fridays    [provider]    Allergies    Penicillins, Amoxicillin, and Morphine and related  Review of Systems   Review of Systems  All other systems reviewed and are negative.   Physical Exam Updated Vital Signs Pulse (!) 122   Resp 13   Ht 5\' 11"  (1.803 m)   Wt 104 kg   SpO2 94%   BMI 31.98 kg/m   Physical Exam Vitals and nursing note reviewed.  Constitutional:      General: She is not in acute distress.    Appearance: She is obese.  HENT:     Head: Normocephalic and atraumatic.     Right Ear: External ear normal.     Left Ear: External ear normal.  Eyes:     Extraocular Movements: Extraocular movements intact.     Conjunctiva/sclera: Conjunctivae normal.  Neck:     Comments: Respiratory is in the room and they are suctioning clear mucus from around her tracheostomy site. Cardiovascular:     Rate and Rhythm: Normal rate.  Pulmonary:     Effort: Pulmonary effort is normal. No respiratory distress.     Breath sounds: Normal breath sounds.     Comments: Patient is coughing Musculoskeletal:        General: Normal range of motion.     Cervical back: Normal range of motion.  Skin:    General: Skin is warm and dry.  Neurological:     General: No focal deficit present.     Mental Status: She is alert and oriented to person, place, and time.     Cranial Nerves: No cranial nerve deficit.  Psychiatric:        Mood and Affect: Mood normal.        Behavior: Behavior normal.        Thought Content: Thought content normal.      ED Results / Procedures / Treatments   Labs (all labs ordered are listed, but only abnormal results are displayed) Labs Reviewed - No data to display  EKG None  Radiology No results found.  Procedures Procedures (including critical care time)  Medications Ordered in ED Medications - No  data to display  ED Course  I have reviewed the triage vital signs and the nursing notes.  Pertinent labs & imaging results that were available during my care of the patient were reviewed by me and considered in my medical decision making (see chart for details).    MDM Rules/Calculators/A&P                          Respiratory therapist was able to place a 4.0 cuffless Shiley trach tube.  I rechecked patient at 5:30 AM she is sleeping peacefully.  I wake her up she indicates she is doing fine.  Final Clinical Impression(s) / ED Diagnoses Final diagnoses:  Tracheostomy mechanical complication ([place])    Rx / DC Orders ED Discharge Orders    None     Plan discharge People  Rolland Porter, MD, Barbette Or, MD 06/25/20 [phone]

## 2020-06-25 NOTE — Discharge Instructions (Addendum)
Recheck as needed °

## 2020-07-06 ENCOUNTER — Emergency Department (HOSPITAL_COMMUNITY): Payer: Medicare Other

## 2020-07-06 ENCOUNTER — Other Ambulatory Visit: Payer: Self-pay

## 2020-07-06 ENCOUNTER — Encounter (HOSPITAL_COMMUNITY): Payer: Self-pay | Admitting: *Deleted

## 2020-07-06 ENCOUNTER — Observation Stay (HOSPITAL_COMMUNITY)
Admission: EM | Admit: 2020-07-06 | Discharge: 2020-07-07 | Disposition: A | Discharge status: Home / Self Care | Payer: Medicare Other | Attending: Family Medicine | Admitting: Family Medicine

## 2020-07-06 DIAGNOSIS — D72829 Elevated white blood cell count, unspecified: Secondary | ICD-10-CM | POA: Diagnosis present

## 2020-07-06 DIAGNOSIS — Z20822 Contact with and (suspected) exposure to covid-19: Secondary | ICD-10-CM | POA: Insufficient documentation

## 2020-07-06 DIAGNOSIS — K219 Gastro-esophageal reflux disease without esophagitis: Secondary | ICD-10-CM | POA: Diagnosis present

## 2020-07-06 DIAGNOSIS — I4891 Unspecified atrial fibrillation: Secondary | ICD-10-CM

## 2020-07-06 DIAGNOSIS — Z930 Tracheostomy status: Secondary | ICD-10-CM

## 2020-07-06 DIAGNOSIS — I679 Cerebrovascular disease, unspecified: Secondary | ICD-10-CM | POA: Diagnosis present

## 2020-07-06 DIAGNOSIS — I5020 Unspecified systolic (congestive) heart failure: Secondary | ICD-10-CM | POA: Diagnosis present

## 2020-07-06 DIAGNOSIS — Z794 Long term (current) use of insulin: Secondary | ICD-10-CM | POA: Insufficient documentation

## 2020-07-06 DIAGNOSIS — I69351 Hemiplegia and hemiparesis following cerebral infarction affecting right dominant side: Secondary | ICD-10-CM | POA: Insufficient documentation

## 2020-07-06 DIAGNOSIS — Z01818 Encounter for other preprocedural examination: Secondary | ICD-10-CM

## 2020-07-06 DIAGNOSIS — I482 Chronic atrial fibrillation, unspecified: Principal | ICD-10-CM | POA: Diagnosis present

## 2020-07-06 DIAGNOSIS — Z66 Do not resuscitate: Secondary | ICD-10-CM | POA: Diagnosis present

## 2020-07-06 DIAGNOSIS — J9601 Acute respiratory failure with hypoxia: Secondary | ICD-10-CM | POA: Insufficient documentation

## 2020-07-06 DIAGNOSIS — C320 Malignant neoplasm of glottis: Secondary | ICD-10-CM | POA: Diagnosis not present

## 2020-07-06 DIAGNOSIS — Z79899 Other long term (current) drug therapy: Secondary | ICD-10-CM | POA: Diagnosis not present

## 2020-07-06 DIAGNOSIS — E1122 Type 2 diabetes mellitus with diabetic chronic kidney disease: Secondary | ICD-10-CM | POA: Insufficient documentation

## 2020-07-06 DIAGNOSIS — I5021 Acute systolic (congestive) heart failure: Secondary | ICD-10-CM | POA: Diagnosis not present

## 2020-07-06 DIAGNOSIS — Z789 Other specified health status: Secondary | ICD-10-CM | POA: Diagnosis present

## 2020-07-06 DIAGNOSIS — E119 Type 2 diabetes mellitus without complications: Secondary | ICD-10-CM

## 2020-07-06 DIAGNOSIS — Z7901 Long term (current) use of anticoagulants: Secondary | ICD-10-CM | POA: Diagnosis not present

## 2020-07-06 DIAGNOSIS — Z955 Presence of coronary angioplasty implant and graft: Secondary | ICD-10-CM | POA: Insufficient documentation

## 2020-07-06 DIAGNOSIS — N183 Chronic kidney disease, stage 3 unspecified: Secondary | ICD-10-CM | POA: Diagnosis not present

## 2020-07-06 DIAGNOSIS — Z87891 Personal history of nicotine dependence: Secondary | ICD-10-CM | POA: Diagnosis not present

## 2020-07-06 DIAGNOSIS — I10 Essential (primary) hypertension: Secondary | ICD-10-CM | POA: Diagnosis present

## 2020-07-06 DIAGNOSIS — J9503 Malfunction of tracheostomy stoma: Secondary | ICD-10-CM

## 2020-07-06 DIAGNOSIS — R0603 Acute respiratory distress: Secondary | ICD-10-CM | POA: Diagnosis present

## 2020-07-06 DIAGNOSIS — I130 Hypertensive heart and chronic kidney disease with heart failure and stage 1 through stage 4 chronic kidney disease, or unspecified chronic kidney disease: Secondary | ICD-10-CM | POA: Diagnosis not present

## 2020-07-06 LAB — COMPREHENSIVE METABOLIC PANEL
ALT: 15 U/L (ref 0–44)
AST: 24 U/L (ref 15–41)
Albumin: 2.8 g/dL — ABNORMAL LOW (ref 3.5–5.0)
Alkaline Phosphatase: 88 U/L (ref 38–126)
Anion gap: 10 (ref 5–15)
BUN: 26 mg/dL — ABNORMAL HIGH (ref 8–23)
CO2: 26 mmol/L (ref 22–32)
Calcium: 9.6 mg/dL (ref 8.9–10.3)
Chloride: 104 mmol/L (ref 98–111)
Creatinine, Ser: 1.63 mg/dL — ABNORMAL HIGH (ref 0.44–1.00)
GFR calc Af Amer: 35 mL/min — ABNORMAL LOW (ref >60)
GFR calc non Af Amer: 31 mL/min — ABNORMAL LOW (ref >60)
Glucose, Bld: 94 mg/dL (ref 70–99)
Potassium: 3.8 mmol/L (ref 3.5–5.1)
Sodium: 140 mmol/L (ref 135–145)
Total Bilirubin: 0.9 mg/dL (ref 0.3–1.2)
Total Protein: 6.9 g/dL (ref 6.5–8.1)

## 2020-07-06 LAB — CBC WITH DIFFERENTIAL/PLATELET
Abs Immature Granulocytes: 0.04 10*3/uL (ref 0.00–0.07)
Basophils Absolute: 0.1 10*3/uL (ref 0.0–0.1)
Basophils Relative: 1 %
Eosinophils Absolute: 0.1 10*3/uL (ref 0.0–0.5)
Eosinophils Relative: 1 %
HCT: 39.9 % (ref 36.0–46.0)
Hemoglobin: 13 g/dL (ref 12.0–15.0)
Immature Granulocytes: 0 %
Lymphocytes Relative: 33 %
Lymphs Abs: 4.4 10*3/uL — ABNORMAL HIGH (ref 0.7–4.0)
MCH: 28.6 pg (ref 26.0–34.0)
MCHC: 32.6 g/dL (ref 30.0–36.0)
MCV: 87.7 fL (ref 80.0–100.0)
Monocytes Absolute: 1.8 10*3/uL — ABNORMAL HIGH (ref 0.1–1.0)
Monocytes Relative: 14 %
Neutro Abs: 6.7 10*3/uL (ref 1.7–7.7)
Neutrophils Relative %: 51 %
Platelets: 316 10*3/uL (ref 150–400)
RBC: 4.55 MIL/uL (ref 3.87–5.11)
RDW: 16.3 % — ABNORMAL HIGH (ref 11.5–15.5)
WBC: 13.2 10*3/uL — ABNORMAL HIGH (ref 4.0–10.5)
nRBC: 0 % (ref 0.0–0.2)

## 2020-07-06 LAB — PROTIME-INR
INR: 1.4 — ABNORMAL HIGH (ref 0.8–1.2)
Prothrombin Time: 16.8 seconds — ABNORMAL HIGH (ref 11.4–15.2)

## 2020-07-06 LAB — GLUCOSE, CAPILLARY: Glucose-Capillary: 149 mg/dL — ABNORMAL HIGH (ref 70–99)

## 2020-07-06 LAB — DIGOXIN LEVEL: Digoxin Level: <0.2 ng/mL — ABNORMAL LOW (ref 0.8–2.0)

## 2020-07-06 LAB — SARS CORONAVIRUS 2 BY RT PCR (HOSPITAL ORDER, PERFORMED IN ~~LOC~~ HOSPITAL LAB): SARS Coronavirus 2: NEGATIVE

## 2020-07-06 MED ORDER — ONDANSETRON HCL 4 MG/2ML IJ SOLN: 4.0000 mg | Freq: Four times a day (QID) | INTRAMUSCULAR | Status: DC | PRN

## 2020-07-06 MED ORDER — NITROGLYCERIN 0.4 MG SL SUBL: 0.4000 mg | SUBLINGUAL_TABLET | SUBLINGUAL | Status: DC | PRN

## 2020-07-06 MED ORDER — SENNOSIDES-DOCUSATE SODIUM 8.6-50 MG PO TABS
1.0000 | ORAL_TABLET | Freq: Every day | ORAL | Status: DC
  Administered 2020-07-06: 1 via ORAL
  Filled 2020-07-06 (×5): qty 1

## 2020-07-06 MED ORDER — PANTOPRAZOLE SODIUM 40 MG PO TBEC
40.0000 mg | DELAYED_RELEASE_TABLET | Freq: Every day | ORAL | Status: DC
  Administered 2020-07-07: 40 mg via ORAL
  Filled 2020-07-06: qty 1

## 2020-07-06 MED ORDER — INSULIN ASPART 100 UNIT/ML ~~LOC~~ SOLN
0.0000 [IU] | Freq: Three times a day (TID) | SUBCUTANEOUS | Status: DC
  Administered 2020-07-07: 2 [IU] via SUBCUTANEOUS

## 2020-07-06 MED ORDER — ORAL CARE MOUTH RINSE
15.0000 mL | Freq: Two times a day (BID) | OROMUCOSAL | Status: DC
  Administered 2020-07-07: 15 mL via OROMUCOSAL

## 2020-07-06 MED ORDER — CHLORHEXIDINE GLUCONATE CLOTH 2 % EX PADS: 6.0000 | MEDICATED_PAD | Freq: Every day | CUTANEOUS | Status: DC

## 2020-07-06 MED ORDER — INSULIN GLARGINE 100 UNIT/ML ~~LOC~~ SOLN
10.0000 [IU] | Freq: Every day | SUBCUTANEOUS | Status: DC
  Administered 2020-07-06: 10 [IU] via SUBCUTANEOUS
  Filled 2020-07-06 (×4): qty 0.1

## 2020-07-06 MED ORDER — MAGNESIUM OXIDE 400 (241.3 MG) MG PO TABS
400.0000 mg | ORAL_TABLET | Freq: Two times a day (BID) | ORAL | Status: DC
  Administered 2020-07-06 – 2020-07-07 (×2): 400 mg via ORAL
  Filled 2020-07-06 (×10): qty 1

## 2020-07-06 MED ORDER — OXYCODONE HCL 5 MG PO TABS
2.5000 mg | ORAL_TABLET | Freq: Four times a day (QID) | ORAL | Status: DC
  Administered 2020-07-06 – 2020-07-07 (×2): 2.5 mg via ORAL
  Filled 2020-07-06 (×2): qty 1

## 2020-07-06 MED ORDER — INSULIN ASPART 100 UNIT/ML ~~LOC~~ SOLN
2.0000 [IU] | Freq: Three times a day (TID) | SUBCUTANEOUS | Status: DC
  Administered 2020-07-07: 2 [IU] via SUBCUTANEOUS

## 2020-07-06 MED ORDER — ATORVASTATIN CALCIUM 40 MG PO TABS: 80.0000 mg | ORAL_TABLET | Freq: Every day | ORAL | Status: DC

## 2020-07-06 MED ORDER — VITAMIN D 25 MCG (1000 UNIT) PO TABS
2000.0000 [IU] | ORAL_TABLET | Freq: Every day | ORAL | Status: DC
  Administered 2020-07-07: 2000 [IU] via ORAL
  Filled 2020-07-06: qty 2

## 2020-07-06 MED ORDER — CHLORHEXIDINE GLUCONATE 0.12 % MT SOLN
15.0000 mL | Freq: Two times a day (BID) | OROMUCOSAL | Status: DC
  Administered 2020-07-06 – 2020-07-07 (×2): 15 mL via OROMUCOSAL
  Filled 2020-07-06 (×2): qty 15

## 2020-07-06 MED ORDER — DULOXETINE HCL 20 MG PO CPEP
40.0000 mg | ORAL_CAPSULE | Freq: Every day | ORAL | Status: DC
  Administered 2020-07-07: 40 mg via ORAL
  Filled 2020-07-06 (×4): qty 2

## 2020-07-06 MED ORDER — WARFARIN - PHARMACIST DOSING INPATIENT: Freq: Every day | Status: DC

## 2020-07-06 MED ORDER — ACETAMINOPHEN 650 MG RE SUPP: 650.0000 mg | Freq: Four times a day (QID) | RECTAL | Status: DC | PRN

## 2020-07-06 MED ORDER — ONDANSETRON HCL 4 MG PO TABS: 4.0000 mg | ORAL_TABLET | Freq: Four times a day (QID) | ORAL | Status: DC | PRN

## 2020-07-06 MED ORDER — WARFARIN SODIUM 3 MG PO TABS
3.0000 mg | ORAL_TABLET | Freq: Once | ORAL | Status: AC
  Administered 2020-07-06: 3 mg via ORAL
  Filled 2020-07-06: qty 1

## 2020-07-06 MED ORDER — METOPROLOL TARTRATE 5 MG/5ML IV SOLN
5.0000 mg | Freq: Once | INTRAVENOUS | Status: AC
  Administered 2020-07-06: 5 mg via INTRAVENOUS
  Filled 2020-07-06: qty 5

## 2020-07-06 MED ORDER — ACETAMINOPHEN 325 MG PO TABS: 650.0000 mg | ORAL_TABLET | Freq: Four times a day (QID) | ORAL | Status: DC | PRN

## 2020-07-06 MED ORDER — DIGOXIN 0.25 MG/ML IJ SOLN
0.1250 mg | Freq: Every day | INTRAMUSCULAR | Status: AC
  Administered 2020-07-06: 0.125 mg via INTRAVENOUS
  Filled 2020-07-06: qty 2

## 2020-07-06 MED ORDER — GABAPENTIN 100 MG PO CAPS
200.0000 mg | ORAL_CAPSULE | Freq: Every day | ORAL | Status: DC
  Administered 2020-07-06: 200 mg via ORAL
  Filled 2020-07-06: qty 2

## 2020-07-06 MED ORDER — DILTIAZEM HCL-DEXTROSE 125-5 MG/125ML-% IV SOLN (PREMIX)
5.0000 mg/h | INTRAVENOUS | Status: DC
  Administered 2020-07-06: 15 mg/h via INTRAVENOUS
  Administered 2020-07-06: 5 mg/h via INTRAVENOUS
  Filled 2020-07-06 (×2): qty 125

## 2020-07-06 NOTE — ED Notes (Signed)
Pt cleaned and depend changed and pt hooked up to external catheter; spoke with pt's son and he stated he is on his way here to see pt; pt is alert and informed her son is on the way

## 2020-07-06 NOTE — ED Notes (Signed)
Pt given meal tray.

## 2020-07-06 NOTE — Progress Notes (Signed)
[place] for warfarin dosing Indication: atrial fibrillation  Allergies  Allergen Reactions  . Penicillins Hives  . Amoxicillin Hives  . Morphine And Related     AMS, per son, she pulled out of her IV after morphine, however seems to be able to tolerate hydrocodone-acetaminophen    Patient Measurements: Height: 5\' 4"  (162.6 cm) Weight: 99.8 kg (220 lb) IBW/kg (Calculated) : 54.7 Heparin Dosing Weight: HEPARIN DW (KG): 77.8  Vital Signs: Temp: 97.7 F (36.5 C) (07/08 0920) Temp Source: Axillary (07/08 0920) BP: 88/49 (07/08 1201) Pulse Rate: 129 (07/08 1100)  Labs: Recent Labs    07/06/20 0924  HGB 13.0  HCT 39.9  PLT 316  LABPROT 16.8*  INR 1.4*  CREATININE 1.63*    Estimated Creatinine Clearance: 34.2 mL/min (A) (by C-G formula based on SCr of 1.63 mg/dL (H)).   Medical History: Past Medical History:  Diagnosis Date  . Acute on chronic respiratory failure with hypoxia ([place])   . Acute on chronic respiratory failure with hypoxia ([place])   . Atrial fibrillation ([place])   . 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 Cardiomyopathy, awaiting cath possible [facility] 12/21/2014  . Chronic atrial fibrillation (HCC)    on coumadin  . Chronic atrial fibrillation ([place])   . Chronic kidney disease, stage III (moderate)   . CVA (cerebral infarction) 2009  . GERD (gastroesophageal reflux disease)   . Hyperlipidemia   . Hypertension   . Restless leg syndrome   . Systolic congestive heart failure with reduced left ventricular function, NYHA class 3 ([place])   . Type II diabetes mellitus ([place])     Assessment: Pharmacy consulted to dose warfarin for this 19 yof with atrial fibrillation on chronic anti-coagulation with warfarin. Upon admission, patient's INR of 1.4  is sub-therapeutic.   Home dose: warfarin 1.5mg  daily  Last dose: 07/05/20 at unknown time H/H: 13.0/39.9   Plates 316K Albumin: 2.8 AST/ALT: 24/15 Concurrent anti-platelet drugs: n/a Drug  interactions: n/a  Goal of Therapy:  INR 2-3 Monitor platelets by anticoagulation protocol: Yes   Plan:  Give warfarin 3mg  today x1 dose (100% boost over usual home dose) Check INR daily and CBC qM-W-F Monitor for s/s of bleeding.   Despina Pole 07/06/2020,12:30 PM

## 2020-07-06 NOTE — ED Triage Notes (Signed)
Pt brought in by [facility] from [facility] after she pulled out her trach x 50 mins ago; PT is alert but tachycardiac HR 180s. Sats 70% on arrival with EMS.

## 2020-07-06 NOTE — ED Provider Notes (Signed)
[facility] EMERGENCY DEPARTMENT Provider Note   CSN: [id number] Arrival date & time: 07/06/20  0102     History Chief Complaint  Patient presents with  . Respiratory Distress    Angel Allen is a 74 y.o. female.  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, she had a 6 oh Shiley that was placed at [facility] in April, she has pulled that out as of mid June, it was replaced with a 4 Shiley, that was removed again on 27 June and she came back to the ER, it was replaced, evidently this morning the patient had again accidentally pulled out her tube, the staff at the facility were unable to put it back in but were able to hold it in place with the obturator, she was breathing through her mouth, there was lots of phlegm that required suctioning by EMS, level 5 caveat applies secondary to severe illness and inability to communicate verbally.  She was noted to be severely tachycardic prehospital as well  Past Medical History:  Diagnosis Date  . Acute on chronic respiratory failure with hypoxia ([place])   . Acute on chronic respiratory failure with hypoxia ([place])   . Atrial fibrillation ([place])   . 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 Cardiomyopathy, awaiting cath possible [facility] 12/21/2014  . Chronic atrial fibrillation (HCC)    on coumadin  . Chronic atrial fibrillation ([place])   . Chronic kidney disease, stage III (moderate)   . CVA (cerebral infarction) 2009  . GERD (gastroesophageal reflux disease)   . Hyperlipidemia   . Hypertension   . Restless leg syndrome   . Systolic congestive heart failure with reduced left ventricular function, NYHA class 3 ([place])   . Type II diabetes mellitus [facility])     Patient Active Problem List   Diagnosis Date Noted  . Chronic atrial  fibrillation with rapid ventricular response ([place]) 07/06/2020  . GERD (gastroesophageal reflux disease)   . Hypertension   . Type II diabetes mellitus ([place])   . Acute on chronic respiratory failure with hypoxia ([place])   . 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 Systolic congestive heart failure with reduced left ventricular function, NYHA class 3 ([place])   . Chronic atrial fibrillation ([place])   . Chronic kidney disease, stage III (moderate)   . Cerebrovascular disease 2009  . 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 DNR (do not resuscitate) 2009  . DNI (do not intubate) 2009  . Leukocytosis 2009    Past Surgical History:  Procedure Laterality Date  . CARDIAC CATHETERIZATION  2012-2013   at OSH, no significant blockage, felt 2/2 stress  . LEFT AND RIGHT HEART CATHETERIZATION WITH CORONARY ANGIOGRAM N/A 12/21/2014   Procedure: LEFT AND RIGHT HEART CATHETERIZATION WITH CORONARY ANGIOGRAM;  Surgeon: Leonie Man, MD;  Location[facility] CATH LAB;  Service: Cardiovascular;  Laterality: N/A;     OB History   No obstetric history on file.     Family History  Problem Relation Age of Onset  . Alzheimer's disease Mother   . CAD Neg Hx     Social History   Tobacco Use  . Smoking status: Former Research scientist (life sciences)  . Smokeless tobacco: Never Used  . Tobacco comment: quit in 1980s  Substance Use Topics  . Alcohol use: No    Alcohol/week:  0.0 standard drinks  . Drug use: No    Home Medications Prior to Admission medications   Medication Sig Start Date End Date Taking? Authorizing Provider  atorvastatin (LIPITOR) 80 MG tablet Take 1 tablet (80 mg total) by mouth daily at 6 PM. 12/22/14  Yes Eileen Stanford, PA-C  Cholecalciferol (VITAMIN D) 50 MCG (2000 UT) tablet Take 2,000 Units by mouth daily.   Yes [provider]  diltiazem (CARDIZEM) 30 MG tablet Take 30 mg by mouth daily.   Yes [provider]  DULoxetine (CYMBALTA) 20 MG capsule Take 40 mg by mouth daily.   Yes [provider]   gabapentin (NEURONTIN) 100 MG capsule Take 200 mg by mouth at bedtime.    Yes [provider]  insulin glargine (LANTUS) 100 UNIT/ML injection Inject 10 Units into the skin at bedtime.   Yes [provider]  insulin lispro (HUMALOG) 100 UNIT/ML injection Inject 2-10 Units into the skin 3 (three) times daily before meals.   Yes [provider]  losartan (COZAAR) 50 MG tablet Take 0.5 tablets (25 mg total) by mouth at bedtime. 12/24/14  Yes Eileen Stanford, PA-C  magnesium oxide (MAG-OX) 400 MG tablet Take 400 mg by mouth 2 (two) times daily.   Yes [provider]  Menthol-Zinc Oxide (CALMOSEPTINE) 0.44-20.6 % OINT Apply topically. Apply to buttocks topically every 8 hours prn for protection   Yes [provider]  metoprolol (LOPRESSOR) 50 MG tablet Take 50 mg by mouth 2 (two) times daily.   Yes [provider]  nitroGLYCERIN (NITROSTAT) 0.4 MG SL tablet Place 1 tablet (0.4 mg total) under the tongue every 5 (five) minutes as needed for chest pain. 12/22/14  Yes Almyra Deforest, PA  oxyCODONE (OXY IR/ROXICODONE) 5 MG immediate release tablet Take 2.5 mg by mouth 4 (four) times daily.   Yes [provider]  Pollen Extracts (PROSTAT PO) Take 30 mLs by mouth in the morning.   Yes [provider]  sennosides-docusate sodium (SENOKOT-S) 8.6-50 MG tablet Take 1 tablet by mouth at bedtime.   Yes [provider]  warfarin (COUMADIN) 1 MG tablet Take 1.5 mg by mouth at bedtime.   Yes [provider]  buPROPion (WELLBUTRIN XL) 150 MG 24 hr tablet Take 150 mg by mouth daily. Patient not taking: Reported on 07/06/2020    [provider]  digoxin (LANOXIN) 0.125 MG tablet Take 0.125 mg by mouth at bedtime. Patient not taking: Reported on 07/06/2020    [provider]  furosemide (LASIX) 40 MG tablet Take 0.5 tablets (20 mg total) by mouth daily. 12/22/14   Eileen Stanford, PA-C  isosorbide mononitrate  (IMDUR) 30 MG 24 hr tablet Take 30 mg by mouth daily. Patient not taking: Reported on 07/06/2020    [provider]  metFORMIN (GLUCOPHAGE) 1000 MG tablet Take 1 tablet (1,000 mg total) by mouth 2 (two) times daily with a meal. Patient not taking: Reported on 07/06/2020 12/24/14   Eileen Stanford, PA-C    Allergies    Penicillins, Amoxicillin, and Morphine and related  Review of Systems   Review of Systems  Unable to perform ROS: Acuity of condition    Physical Exam Updated Vital Signs BP (!) 88/49   Pulse (!) 129   Temp 97.7 F (36.5 C) (Axillary)   Resp (!) 22   Ht 1.626 m (5\' 4" )   Wt 99.8 kg   SpO2 100%   BMI 37.76 kg/m   Physical  Exam Vitals and nursing note reviewed.  Constitutional:      General: She is in acute distress.     Appearance: She is well-developed. She is ill-appearing and toxic-appearing.  HENT:     Head: Normocephalic and atraumatic.     Mouth/Throat:     Pharynx: No oropharyngeal exudate.  Eyes:     General: No scleral icterus.       Right eye: No discharge.        Left eye: No discharge.     Conjunctiva/sclera: Conjunctivae normal.     Pupils: Pupils are equal, round, and reactive to light.  Neck:     Thyroid: No thyromegaly.     Vascular: No JVD.     Comments: Tracheostomy opening is small, small amount of drainage and discharge, small amount of bleeding, appears patent but very small, moving air Cardiovascular:     Rate and Rhythm: Regular rhythm. Tachycardia present.     Heart sounds: Normal heart sounds. No murmur heard.  No friction rub. No gallop.   Pulmonary:     Effort: Pulmonary effort is normal. No respiratory distress.     Breath sounds: Normal breath sounds. No wheezing or rales.     Comments: Diffuse rhonchi Abdominal:     General: Bowel sounds are normal. There is no distension.     Palpations: Abdomen is soft. There is no mass.     Tenderness: There is no abdominal tenderness.  Musculoskeletal:        General:  No tenderness. Normal range of motion.     Cervical back: Normal range of motion and neck supple.  Lymphadenopathy:     Cervical: No cervical adenopathy.  Skin:    General: Skin is warm and dry.     Findings: No erythema or rash.  Neurological:     Mental Status: She is alert.     Coordination: Coordination normal.     Comments: Follows commands, grips bilaterally, opens eyes, cannot talk  Psychiatric:        Behavior: Behavior normal.     ED Results / Procedures / Treatments   Labs (all labs ordered are listed, but only abnormal results are displayed) Labs Reviewed  CBC WITH DIFFERENTIAL/PLATELET - Abnormal; Notable for the following components:      Result Value   WBC 13.2 (*)    RDW 16.3 (*)    Lymphs Abs 4.4 (*)    Monocytes Absolute 1.8 (*)    All other components within normal limits  COMPREHENSIVE METABOLIC PANEL - Abnormal; Notable for the following components:   BUN 26 (*)    Creatinine, Ser 1.63 (*)    Albumin 2.8 (*)    GFR calc non Af Amer 31 (*)    GFR calc Af Amer 35 (*)    All other components within normal limits  PROTIME-INR - Abnormal; Notable for the following components:   Prothrombin Time 16.8 (*)    INR 1.4 (*)    All other components within normal limits  DIGOXIN LEVEL - Abnormal; Notable for the following components:   Digoxin Level <0.2 (*)    All other components within normal limits    EKG EKG Interpretation  Date/Time:  Thursday July 06 2020 09:12:17 EDT Ventricular Rate:  179 PR Interval:    QRS Duration: 110 QT Interval:  262 QTC Calculation: 453 R Axis:   -50 Text Interpretation: Atrial fibrillation with rapid V-rate LAD, consider left anterior fascicular block Borderline low voltage, extremity leads Abnormal  R-wave progression, late transition Repolarization abnormality, prob rate related Artifact in lead(s) III V3 V4 V5 V6 Confirmed by Noemi Chapel [phone]) on 07/06/2020 10:48:27 AM   Radiology DG Chest Portable 1 View  Result  Date: 07/06/2020 CLINICAL DATA:  Post et tube placement EXAM: PORTABLE CHEST 1 VIEW COMPARISON:  Chest radiograph 12/20/2014 FINDINGS: The endotracheal tube tip terminates within the right mainstem bronchus. Left ventricular pacing device in place. The mediastinal contours appear widened compared to the prior study which could be related to AP portable technique. Heart size is enlarged. Visualized portions of the lungs are clear. The patient's right hand overlies the mid right lung. No pneumothorax or significant pleural effusion. No acute finding in the visualized skeleton. IMPRESSION: 1. Endotracheal tube tip terminates within the right mainstem bronchus. Recommend repositioning. 2. Widened mediastinal contours which could be related to AP portable technique. Recommend attention on follow-up. These results were called by telephone at the time of interpretation on 07/06/2020 at 9:36 am to provider [facility] , who verbally acknowledged these results. Electronically Signed   By: Audie Pinto M.D.   On: 07/06/2020 09:37   DG Chest Port 1V same Day  Result Date: 07/06/2020 CLINICAL DATA:  Status post tracheostomy EXAM: PORTABLE CHEST 1 VIEW COMPARISON:  07/06/2020 FINDINGS: Previously noted percutaneous endotracheal tube has been exchanged for a formal tracheostomy device which overlies its expected position at the thoracic inlet. Lungs are clear. No pneumothorax or pleural effusion. Moderate cardiomegaly is stable. Left subclavian single lead pacemaker defibrillator is in expected position. No acute bone abnormality. IMPRESSION: Tracheostomy in expected position. Electronically Signed   By: Fidela Salisbury MD   On: 07/06/2020 10:05    Procedures TRACHEOSTOMY REPLACEMENT  Date/Time: 07/06/2020 9:29 AM Performed by: Noemi Chapel, MD Authorized by: Noemi Chapel, MD  Indications: fell out Local anesthesia used: no  Anesthesia: Local anesthesia used: no  Sedation: Patient sedated: no  Preparation:  Patient was prepped and draped in the usual sterile fashion. Tube type: single cannula Tube cuff: cuffless Tube size: 4.0 mm Complications: mucous plug and dyspnea Patient tolerance: patient tolerated the procedure well with no immediate complications Comments: Chest x-ray ordered to confirm placement, clinically good CO2 detector change and bilateral airway sounds   .Critical Care Performed by: Noemi Chapel, MD Authorized by: Noemi Chapel, MD   Critical care provider statement:    Critical care time (minutes):  35   Critical care time was exclusive of:  Separately billable procedures and treating other patients and teaching time   Critical care was necessary to treat or prevent imminent or life-threatening deterioration of the following conditions:  Respiratory failure (Acute respiratory failure)   Critical care was time spent personally by me on the following activities:  Blood draw for specimens, development of treatment plan with patient or surrogate, discussions with consultants, evaluation of patient's response to treatment, examination of patient, obtaining history from patient or surrogate, ordering and performing treatments and interventions, ordering and review of laboratory studies, ordering and review of radiographic studies, pulse oximetry, re-evaluation of patient's condition and review of old charts   (including critical care time)  Medications Ordered in ED Medications  diltiazem (CARDIZEM) 125 mg in dextrose 5% 125 mL (1 mg/mL) infusion (15 mg/hr Intravenous Rate/Dose Change 07/06/20 1226)  warfarin (COUMADIN) tablet 3 mg (has no administration in time range)  Warfarin - Pharmacist Dosing Inpatient (has no administration in time range)  metoprolol tartrate (LOPRESSOR) injection 5 mg (5 mg Intravenous Given 07/06/20 0942)  ED Course  I have reviewed the triage vital signs and the nursing notes.  Pertinent labs & imaging results that were available during my care of the  patient were reviewed by me and considered in my medical decision making (see chart for details).    MDM Rules/Calculators/A&P                          The patient is critically ill-appearing, she has pulled out her trach, she was hypoxic, had plenty of sputum, required multiple suctioning attempts.  The obturator was removed from the tracheostomy site and I replaced this with a small pediatric endotracheal tube.  I then remove that and switched it with a larger pediatric endotracheal tube.  Awaiting tracheostomies from ICU at this time.  Pulmonology is not available at this hospital, the patient is severely tachycardic, she will likely need to be discussed with consulting physician, and have ENT comment and direct her tracheostomy care and maintenance, if able to replace tracheostomy tube  Tracheostomy was able to be replaced with a 4 Shiley, at this time the patient has a stable airway, x-ray show that is in good position, there is no signs of pneumonia collapse atelectasis bronchiectasis or pneumothorax.  The patient has been in a constant atrial fibrillation with a rapid ventricular rate and is required both metoprolol and now a Cardizem drip secondary to this rapid rate.  I discussed this with the hospitalist who is been kind enough to admit the patient to high level of care.  This patient is critically ill with ongoing atrial fibrillation with rapid ventricular rate and acute hypoxic respiratory failure  Final Clinical Impression(s) / ED Diagnoses Final diagnoses:  Atrial fibrillation with rapid ventricular response ([place])  Tracheostomy mechanical complication ([place])  Acute respiratory failure with hypoxia (HCC)      Noemi Chapel, MD 07/06/20 1239

## 2020-07-06 NOTE — ED Notes (Signed)
Pt given cup of ice and apple sauce; pt's son at bedside

## 2020-07-06 NOTE — Procedures (Signed)
Tracheostomy Change Note  Patient Details:   Name: Angel Allen DOB: 15-Mar-1946 MRN: [id number]    Airway Documentation:     Evaluation  O2 sats: currently acceptable Complications: No apparent complications Patient did tolerate procedure well.      Elsie Stain 07/06/2020, 9:40 AM

## 2020-07-06 NOTE — H&P (Signed)
History and Physical  [place] KGM:[id number] DOB: [birth year] DOA: 07/06/2020  PCP: Deanne Coffer, MD  Patient coming from: [place]  I have personally briefly reviewed patient's old medical records in [place]  Chief Complaint: pulled out trach   HPI: Angel Allen is a 74 y.o. 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  She was deemed not to be a chemotherapy candidate given her poor performance status.  Patient had declined palliative radiation treatments and she was not felt to be a good surgical candidate due to her multiple medical comorbidities.  The patient had requested to be DNR and was allowed to eat eat.  She had her trach placed at [facility].  She had been doing well with her trach and able to vocalize and eat using Passy-Muir valve.  Unfortunately she did not make significant progress with rehabilitation.  She has been sent to the emergency department several times in the past several weeks after pulling out her trach.  She has a history of CVA with minimal right residual deficits.  She has chronic atrial fibrillation on anticoagulation with warfarin.  She has nonischemic cardiomyopathy with most recent echo report showing an EF of 55%.  She has chronic kidney disease type 2 diabetes mellitus.  She has depression.  She is DNR/DNI.  ED Course: Tracheostomy cuffless tube inserted by ED physician and respiratory therapy with improvement in oxygen saturations.  Dr. Sabra Heck reported that tracheostomy was able to be replaced with a [place] and x-ray shows that it is in good position.  There was no pneumonia or atelectasis or pneumothorax.  Unfortunately the patient was severely tachycardic noted to be in atrial fibrillation with RVR and was started on a Cardizem infusion.  She was  admitted for A. fib with RVR and acute hypoxic respiratory failure secondary to pulling out trach.  Review of Systems: UTO due to current condition     Past Medical History:  Diagnosis Date   Acute on chronic respiratory failure with hypoxia (HCC)    Acute on chronic respiratory failure with hypoxia (HCC)    Atrial fibrillation (HCC)    Cancer of glottis (HCC)    Cardiomyopathy, awaiting cath possible Takatsubo 12/21/2014   Chronic atrial fibrillation (HCC)    on coumadin   Chronic atrial fibrillation (HCC)    Chronic kidney disease, stage III (moderate)    CVA (cerebral infarction) 2009   GERD (gastroesophageal reflux disease)    Hyperlipidemia    Hypertension    Restless leg syndrome    Systolic congestive heart failure with reduced left ventricular function, NYHA class 3 ([place])    Type II diabetes mellitus ([place])     Past Surgical History:  Procedure Laterality Date   CARDIAC CATHETERIZATION  2012-2013   at OSH, no significant blockage, felt 2/2 stress   LEFT AND RIGHT HEART CATHETERIZATION WITH CORONARY ANGIOGRAM N/A 12/21/2014   Procedure: LEFT AND RIGHT HEART CATHETERIZATION WITH CORONARY ANGIOGRAM;  Surgeon: Leonie Man, MD;  Location: [facility] CATH LAB;  Service: Cardiovascular;  Laterality: N/A;     reports that she has quit smoking. She has never used smokeless tobacco. She reports that she does not drink alcohol and does not use drugs.  Allergies  Allergen Reactions   Penicillins Hives   Amoxicillin Hives   Morphine And  Related     AMS, per son, she pulled out of her IV after morphine, however seems to be able to tolerate hydrocodone-acetaminophen    Family History  Problem Relation Age of Onset   Alzheimer's disease Mother    CAD Neg Hx      Prior to Admission medications   Medication Sig Start Date End Date Taking? Authorizing Provider  atorvastatin (LIPITOR) 80 MG tablet Take 1 tablet (80 mg total) by mouth daily at 6 PM. 12/22/14   Yes Eileen Stanford, PA-C  Cholecalciferol (VITAMIN D) 50 MCG (2000 UT) tablet Take 2,000 Units by mouth daily.   Yes [provider]  diltiazem (CARDIZEM) 30 MG tablet Take 30 mg by mouth daily.   Yes [provider]  DULoxetine (CYMBALTA) 20 MG capsule Take 40 mg by mouth daily.   Yes [provider]  gabapentin (NEURONTIN) 100 MG capsule Take 200 mg by mouth at bedtime.    Yes [provider]  insulin glargine (LANTUS) 100 UNIT/ML injection Inject 10 Units into the skin at bedtime.   Yes [provider]  insulin lispro (HUMALOG) 100 UNIT/ML injection Inject 2-10 Units into the skin 3 (three) times daily before meals.   Yes [provider]  losartan (COZAAR) 50 MG tablet Take 0.5 tablets (25 mg total) by mouth at bedtime. 12/24/14  Yes Eileen Stanford, PA-C  magnesium oxide (MAG-OX) 400 MG tablet Take 400 mg by mouth 2 (two) times daily.   Yes [provider]  Menthol-Zinc Oxide (CALMOSEPTINE) 0.44-20.6 % OINT Apply topically. Apply to buttocks topically every 8 hours prn for protection   Yes [provider]  metoprolol (LOPRESSOR) 50 MG tablet Take 50 mg by mouth 2 (two) times daily.   Yes [provider]  nitroGLYCERIN (NITROSTAT) 0.4 MG SL tablet Place 1 tablet (0.4 mg total) under the tongue every 5 (five) minutes as needed for chest pain. 12/22/14  Yes Almyra Deforest, PA  oxyCODONE (OXY IR/ROXICODONE) 5 MG immediate release tablet Take 2.5 mg by mouth 4 (four) times daily.   Yes [provider]  Pollen Extracts (PROSTAT PO) Take 30 mLs by mouth in the morning.   Yes [provider]  sennosides-docusate sodium (SENOKOT-S) 8.6-50 MG tablet Take 1 tablet by mouth at bedtime.   Yes [provider]  warfarin (COUMADIN) 1 MG tablet Take 1.5 mg by mouth at bedtime.   Yes [provider]  buPROPion (WELLBUTRIN XL) 150 MG 24 hr tablet Take 150 mg by mouth daily. Patient not taking:  Reported on 07/06/2020    [provider]  digoxin (LANOXIN) 0.125 MG tablet Take 0.125 mg by mouth at bedtime. Patient not taking: Reported on 07/06/2020    [provider]  furosemide (LASIX) 40 MG tablet Take 0.5 tablets (20 mg total) by mouth daily. 12/22/14   Eileen Stanford, PA-C  isosorbide mononitrate (IMDUR) 30 MG 24 hr tablet Take 30 mg by mouth daily. Patient not taking: Reported on 07/06/2020    [provider]  metFORMIN (GLUCOPHAGE) 1000 MG tablet Take 1 tablet (1,000 mg total) by mouth 2 (two) times daily with a meal. Patient not taking: Reported on 07/06/2020 12/24/14   Eileen Stanford, PA-C    Physical Exam: Vitals:   07/06/20 0933 07/06/20 0941 07/06/20 0959 07/06/20 1100  BP: (!) 109/93  (!) 92/51 105/77  Pulse:    (!) 129  Resp: (!) 24  18 17   Temp:  TempSrc:      SpO2: 99% 99%  100%  Weight:      Height:       Constitutional: Patient appears chronically ill and in no apparent distress. Eyes: PERRL, lids and conjunctivae normal ENMT: Mucous membranes are moist. Posterior pharynx clear of any exudate or lesions. edentulous. Neck: Tracheostomy in place Respiratory: Rales heard left lower lobe, no wheezing, no crackles. Normal respiratory effort. No accessory muscle use.  Cardiovascular: irregularly irregular, tachycardic rate, no murmurs / rubs / gallops. No extremity edema. 2+ pedal pulses. No carotid bruits.  Abdomen: no tenderness, no masses palpated. No hepatosplenomegaly. Bowel sounds positive.  Musculoskeletal: no clubbing / cyanosis. No joint deformity upper and lower extremities. Good ROM, no contractures. Normal muscle tone.  Skin: no rashes, lesions, ulcers. No induration Neurologic: CN 2-12 grossly intact. Sensation intact, DTR normal. Strength 5/5 in all 4.  Psychiatric:  Alert and oriented x 3.  Flat affect.   Labs on Admission: I have personally reviewed following labs and imaging studies  CBC: Recent Labs  Lab  07/06/20 0924  WBC 13.2*  NEUTROABS 6.7  HGB 13.0  HCT 39.9  MCV 87.7  PLT 888   Basic Metabolic Panel: Recent Labs  Lab 07/06/20 0924  NA 140  K 3.8  CL 104  CO2 26  GLUCOSE 94  BUN 26*  CREATININE 1.63*  CALCIUM 9.6   GFR: Estimated Creatinine Clearance: 34.2 mL/min (A) (by C-G formula based on SCr of 1.63 mg/dL (H)). Liver Function Tests: Recent Labs  Lab 07/06/20 0924  AST 24  ALT 15  ALKPHOS 88  BILITOT 0.9  PROT 6.9  ALBUMIN 2.8*   No results for input(s): LIPASE, AMYLASE in the last 168 hours. No results for input(s): AMMONIA in the last 168 hours. Coagulation Profile: Recent Labs  Lab 07/06/20 0924  INR 1.4*   Cardiac Enzymes: No results for input(s): CKTOTAL, CKMB, CKMBINDEX, TROPONINI in the last 168 hours. BNP (last 3 results) No results for input(s): PROBNP in the last 8760 hours. HbA1C: No results for input(s): HGBA1C in the last 72 hours. CBG: No results for input(s): GLUCAP in the last 168 hours. Lipid Profile: No results for input(s): CHOL, HDL, LDLCALC, TRIG, CHOLHDL, LDLDIRECT in the last 72 hours. Thyroid Function Tests: No results for input(s): TSH, T4TOTAL, FREET4, T3FREE, THYROIDAB in the last 72 hours. Anemia Panel: No results for input(s): VITAMINB12, FOLATE, FERRITIN, TIBC, IRON, RETICCTPCT in the last 72 hours. Urine analysis: No results found for: COLORURINE, APPEARANCEUR, LABSPEC, [place], GLUCOSEU, [place], BILIRUBINUR, KETONESUR, PROTEINUR, UROBILINOGEN, NITRITE, LEUKOCYTESUR  Radiological Exams on Admission: DG Chest Portable 1 View  Result Date: 07/06/2020 CLINICAL DATA:  Post et tube placement EXAM: PORTABLE CHEST 1 VIEW COMPARISON:  Chest radiograph 12/20/2014 FINDINGS: The endotracheal tube tip terminates within the right mainstem bronchus. Left ventricular pacing device in place. The mediastinal contours appear widened compared to the prior study which could be related to AP portable technique. Heart size is enlarged.  Visualized portions of the lungs are clear. The patient's right hand overlies the mid right lung. No pneumothorax or significant pleural effusion. No acute finding in the visualized skeleton. IMPRESSION: 1. Endotracheal tube tip terminates within the right mainstem bronchus. Recommend repositioning. 2. Widened mediastinal contours which could be related to AP portable technique. Recommend attention on follow-up. These results were called by telephone at the time of interpretation on 07/06/2020 at 9:36 am to provider [facility] , who verbally acknowledged these results. Electronically Signed   By:  Audie Pinto M.D.   On: 07/06/2020 09:37   DG Chest Port 1V same Day  Result Date: 07/06/2020 CLINICAL DATA:  Status post tracheostomy EXAM: PORTABLE CHEST 1 VIEW COMPARISON:  07/06/2020 FINDINGS: Previously noted percutaneous endotracheal tube has been exchanged for a formal tracheostomy device which overlies its expected position at the thoracic inlet. Lungs are clear. No pneumothorax or pleural effusion. Moderate cardiomegaly is stable. Left subclavian single lead pacemaker defibrillator is in expected position. No acute bone abnormality. IMPRESSION: Tracheostomy in expected position. Electronically Signed   By: Fidela Salisbury MD   On: 07/06/2020 10:05   EKG: Independently reviewed.  Atrial fibrillation with rapid ventricular response  Assessment/Plan Principal Problem:   Chronic atrial fibrillation with rapid ventricular response (HCC) Active Problems:   Cancer of glottis (HCC)   Systolic congestive heart failure with reduced left ventricular function, NYHA class 3 (HCC)   Chronic atrial fibrillation (HCC)   Chronic kidney disease, stage III (moderate)   GERD (gastroesophageal reflux disease)   Hypertension   Type II diabetes mellitus ([place])   Cerebrovascular disease   Tracheostomy in place [facility])   DNR (do not resuscitate)   DNI (do not intubate)   Leukocytosis   1. Chronic atrial  fibrillation with rapid ventricular response-unsure if she has been taking her medications at the SNF facility regularly however she has been started on IV diltiazem infusion with goal to control rate as blood pressure tolerates.  She had been on metoprolol and digoxin and Cardizem.  It appears that she had been taken off digoxin recently possibly with last hospitalization.  She likely will be restarted on her home metoprolol and Cardizem will be added for better rate control as tolerated.  No need to repeat 2D echo at this time unless he continued to have difficulty with controlling heart rate.  She denies chest pain and shortness of breath at this time. 2. Squamous cell carcinoma of the glottis-there are no curative or palliative treatment options available other than placement of trach for airway patency.  This is a palliative measure only and patient understands and accepts this. 3. Cerebrovascular disease status post CVA-patient has minimal right residual deficits and remains on statin and is fully anticoagulated for A. fib with warfarin. 4. Acquired thrombophilia-Pharm.D. consulted to assist with warfarin management while in hospital.  Monitor PT/INR closely. 5. Nonischemic cardiomyopathy most recent EF 55%.  Monitor blood pressure. 6. Type 2 diabetes mellitus with vascular complications-SSI coverage ordered to follow. 7. Depression resume home Cymbalta and bupropion. 8. GERD-Protonix ordered for GI protection. 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  Resume home ARB.  Follow blood pressures and renal function panel. 10. Coronary artery disease-resume home statin.  DVT prophylaxis: warfarin   Code Status: Full   Family Communication: no family present at bedside  Disposition Plan: Return to [facility] when HR improved   Consults called:   Admission status: iNP   Critical Care Procedure Note Authorized and Performed by: Murvin Natal MD  Total Critical Care time:  65  minutes  Due to a high probability of clinically significant, life threatening deterioration, the patient required my highest level of preparedness to intervene emergently and I personally spent this critical care time directly and personally managing the patient.  This critical care time included obtaining a history; examining the patient, pulse oximetry; ordering and review of studies; arranging urgent treatment with development of a management plan; evaluation of patient's response of treatment; frequent reassessment; and  discussions with other providers.  This critical care time was performed to assess and manage the high probability of imminent and life threatening deterioration that could result in multi-organ failure.  It was exclusive of separately billable procedures and treating other patients and teaching time.   Irwin Brakeman MD [facility] How to contact the [facility] Attending or Consulting provider [place] or covering provider during after hours [place], for this patient?  1. Check the care team in [facility] and look for a) attending/consulting [facility] provider listed and b) the [facility] team listed 2. Log into [email or web address] and use [place]'s universal password to access. If you do not have the password, please contact the hospital operator. 3. Locate the [facility] provider you are looking for under [facility] and page to a number that you can be directly reached. 4. If you still have difficulty reaching the provider, please page the [facility] (Director on Call) for the Hospitalists listed on amion for assistance.   If 7PM-7AM, please contact night-coverage [email or web address] Password [facility]  07/06/2020, 11:48 AM

## 2020-07-06 NOTE — ED Notes (Signed)
4.0 Tracheostomy cuffless tube inserted at this time by EDP and respiratory therapy. Oxygen saturations improving at this time.

## 2020-07-07 DIAGNOSIS — I679 Cerebrovascular disease, unspecified: Secondary | ICD-10-CM | POA: Diagnosis not present

## 2020-07-07 DIAGNOSIS — I482 Chronic atrial fibrillation, unspecified: Secondary | ICD-10-CM | POA: Diagnosis not present

## 2020-07-07 DIAGNOSIS — C320 Malignant neoplasm of glottis: Secondary | ICD-10-CM | POA: Diagnosis not present

## 2020-07-07 DIAGNOSIS — N183 Chronic kidney disease, stage 3 unspecified: Secondary | ICD-10-CM | POA: Diagnosis not present

## 2020-07-07 DIAGNOSIS — Z789 Other specified health status: Secondary | ICD-10-CM

## 2020-07-07 LAB — COMPREHENSIVE METABOLIC PANEL
ALT: 12 U/L (ref 0–44)
AST: 17 U/L (ref 15–41)
Albumin: 2.2 g/dL — ABNORMAL LOW (ref 3.5–5.0)
Alkaline Phosphatase: 73 U/L (ref 38–126)
Anion gap: 5 (ref 5–15)
BUN: 30 mg/dL — ABNORMAL HIGH (ref 8–23)
CO2: 28 mmol/L (ref 22–32)
Calcium: 8.7 mg/dL — ABNORMAL LOW (ref 8.9–10.3)
Chloride: 103 mmol/L (ref 98–111)
Creatinine, Ser: 1.41 mg/dL — ABNORMAL HIGH (ref 0.44–1.00)
GFR calc Af Amer: 42 mL/min — ABNORMAL LOW (ref >60)
GFR calc non Af Amer: 36 mL/min — ABNORMAL LOW (ref >60)
Glucose, Bld: 119 mg/dL — ABNORMAL HIGH (ref 70–99)
Potassium: 3.7 mmol/L (ref 3.5–5.1)
Sodium: 136 mmol/L (ref 135–145)
Total Bilirubin: 0.7 mg/dL (ref 0.3–1.2)
Total Protein: 5.4 g/dL — ABNORMAL LOW (ref 6.5–8.1)

## 2020-07-07 LAB — CBC WITH DIFFERENTIAL/PLATELET
Abs Immature Granulocytes: 0.03 10*3/uL (ref 0.00–0.07)
Basophils Absolute: 0 10*3/uL (ref 0.0–0.1)
Basophils Relative: 1 %
Eosinophils Absolute: 0.2 10*3/uL (ref 0.0–0.5)
Eosinophils Relative: 2 %
HCT: 32.2 % — ABNORMAL LOW (ref 36.0–46.0)
Hemoglobin: 10 g/dL — ABNORMAL LOW (ref 12.0–15.0)
Immature Granulocytes: 0 %
Lymphocytes Relative: 31 %
Lymphs Abs: 2.3 10*3/uL (ref 0.7–4.0)
MCH: 26.9 pg (ref 26.0–34.0)
MCHC: 31.1 g/dL (ref 30.0–36.0)
MCV: 86.6 fL (ref 80.0–100.0)
Monocytes Absolute: 1 10*3/uL (ref 0.1–1.0)
Monocytes Relative: 14 %
Neutro Abs: 3.8 10*3/uL (ref 1.7–7.7)
Neutrophils Relative %: 52 %
Platelets: 228 10*3/uL (ref 150–400)
RBC: 3.72 MIL/uL — ABNORMAL LOW (ref 3.87–5.11)
RDW: 16.1 % — ABNORMAL HIGH (ref 11.5–15.5)
WBC: 7.3 10*3/uL (ref 4.0–10.5)
nRBC: 0 % (ref 0.0–0.2)

## 2020-07-07 LAB — HEMOGLOBIN A1C
Hgb A1c MFr Bld: 5.9 % — ABNORMAL HIGH (ref 4.8–5.6)
Mean Plasma Glucose: 122.63 mg/dL

## 2020-07-07 LAB — GLUCOSE, CAPILLARY
Glucose-Capillary: 123 mg/dL — ABNORMAL HIGH (ref 70–99)
Glucose-Capillary: 79 mg/dL (ref 70–99)
Glucose-Capillary: 141 mg/dL — ABNORMAL HIGH (ref 70–99)

## 2020-07-07 LAB — MAGNESIUM: Magnesium: 1.7 mg/dL (ref 1.7–2.4)

## 2020-07-07 LAB — PROTIME-INR
INR: 1.7 — ABNORMAL HIGH (ref 0.8–1.2)
Prothrombin Time: 19.5 seconds — ABNORMAL HIGH (ref 11.4–15.2)

## 2020-07-07 LAB — MRSA PCR SCREENING: MRSA by PCR: POSITIVE — AB

## 2020-07-07 MED ORDER — FUROSEMIDE 40 MG PO TABS: 20.0000 mg | ORAL_TABLET | Freq: Every day | ORAL | 1 refills | Status: DC

## 2020-07-07 MED ORDER — WARFARIN SODIUM 2 MG PO TABS: 3.0000 mg | ORAL_TABLET | Freq: Once | ORAL | Status: DC

## 2020-07-07 MED ORDER — CHLORHEXIDINE GLUCONATE CLOTH 2 % EX PADS
6.0000 | MEDICATED_PAD | Freq: Every day | CUTANEOUS | Status: DC
  Administered 2020-07-07: 6 via TOPICAL

## 2020-07-07 MED ORDER — WARFARIN SODIUM 2 MG PO TABS: 2.0000 mg | ORAL_TABLET | Freq: Every day | ORAL | Status: DC

## 2020-07-07 MED ORDER — OXYCODONE HCL 5 MG PO TABS: 2.5000 mg | ORAL_TABLET | Freq: Four times a day (QID) | ORAL | 0 refills | Status: AC

## 2020-07-07 MED ORDER — DILTIAZEM HCL 30 MG PO TABS: 30.0000 mg | ORAL_TABLET | Freq: Every day | ORAL | Status: DC | PRN

## 2020-07-07 MED ORDER — METOPROLOL TARTRATE 50 MG PO TABS
50.0000 mg | ORAL_TABLET | Freq: Two times a day (BID) | ORAL | Status: DC
  Administered 2020-07-07: 50 mg via ORAL
  Filled 2020-07-07: qty 1

## 2020-07-07 MED ORDER — MUPIROCIN 2 % EX OINT
1.0000 "application " | TOPICAL_OINTMENT | Freq: Two times a day (BID) | CUTANEOUS | Status: DC
  Administered 2020-07-07: 1 via NASAL
  Filled 2020-07-07: qty 22

## 2020-07-07 NOTE — Progress Notes (Signed)
[place] for warfarin dosing Indication: atrial fibrillation  Allergies  Allergen Reactions  . Penicillins Hives  . Amoxicillin Hives  . Morphine And Related     AMS, per son, she pulled out of her IV after morphine, however seems to be able to tolerate hydrocodone-acetaminophen    Patient Measurements: Height: 5\' 5"  (165.1 cm) Weight: 90.4 kg (199 lb 4.7 oz) IBW/kg (Calculated) : 57 Heparin Dosing Weight: HEPARIN DW (KG): 77  Vital Signs: Temp: 98.3 F (36.8 C) (07/09 0749) Temp Source: Axillary (07/09 0749) BP: 106/73 (07/09 0800) Pulse Rate: 87 (07/09 0802)  Labs: Recent Labs    07/06/20 0924 07/07/20 0453  HGB 13.0 10.0*  HCT 39.9 32.2*  PLT 316 228  LABPROT 16.8* 19.5*  INR 1.4* 1.7*  CREATININE 1.63* 1.41*    Estimated Creatinine Clearance: 38.3 mL/min (A) (by C-G formula based on SCr of 1.41 mg/dL (H)).    Assessment: Pharmacy consulted to dose warfarin for this 69 yof with atrial fibrillation on chronic anti-coagulation with warfarin. Upon admission, patient's INR of 1.4  was sub-therapeutic.   Home dose: warfarin 1.5mg  daily  Last dose: 07/05/20 at unknown time H/H: 13.0/39.9   Plates 316K Albumin: 2.8 AST/ALT: 24/15 Concurrent anti-platelet drugs: n/a Drug interactions: n/a  07/07/20 1000 update INR: 1.7-->rose by 0.3, but still sub-therapeutic CBC: H/H: 10.0/32.2   Plates 316>228  Goal of Therapy:  INR 2-3 Monitor platelets by anticoagulation protocol: Yes   Plan:  Repeat warfarin 3mg  today x1 dose for INR 1.7  (100% boost over usual home dose) Check INR daily and CBC qM-W-F Monitor for s/s of bleeding.   Despina Pole 07/07/2020,9:50 AM

## 2020-07-07 NOTE — Progress Notes (Signed)
Patient placed on Venturi  28%  Oxygen TC for transport to assisted living facility.

## 2020-07-07 NOTE — Care Management Obs Status (Signed)
[place] NOTIFICATION   Patient Details  Name: Angel Allen MRN: [id number] Date of Birth: 1946/07/20   Medicare Observation Status Notification Given:  Yes    Salome Arnt, [place] 07/07/2020, 11:49 AM

## 2020-07-07 NOTE — Discharge Summary (Signed)
Physician Discharge Summary  Angel Allen ZRA:[id number] DOB: 04-23-1946 DOA: 07/06/2020  PCP: Deanne Coffer, MD  Admit date: 07/06/2020 Discharge date: 07/07/2020  Admitted From:  SNF  Disposition:   SNF [facility])  Recommendations for Outpatient Follow-up:  1. Please check PT/INR daily until INR therapeutic 2. Adjust warfarin dose as needed to make INR therapeutic 3. Monitor for any signs of bleeding complications 4. Please check CBC in 3 days to follow hemoglobin levels.   Discharge Condition: STABLE   CODE STATUS: DNR    Brief Hospitalization Summary: Please see all hospital notes, images, labs for full details of the hospitalization. ADMISSION HPI: Angel Allen is a 74 y.o. 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  She was deemed not to be a chemotherapy candidate given her poor performance status.  Patient had declined palliative radiation treatments and she was not felt to be a good surgical candidate due to her multiple medical comorbidities.  The patient had requested to be DNR and was allowed to eat eat.  She had her trach placed at [facility].  She had been doing well with her trach and able to vocalize and eat using Passy-Muir valve.  Unfortunately she did not make significant progress with rehabilitation.  She has been sent to the emergency department several times in the past several weeks after pulling out her trach.  She has a history of CVA with minimal right residual deficits.  She has chronic atrial fibrillation on anticoagulation with warfarin.  She has nonischemic cardiomyopathy with most recent echo report showing an EF of 55%.  She has chronic kidney disease type 2 diabetes mellitus.  She has depression.  She is DNR/DNI.  ED Course: Tracheostomy cuffless tube inserted by ED physician and  respiratory therapy with improvement in oxygen saturations.  Dr. Sabra Heck reported that tracheostomy was able to be replaced with a [place] and x-ray shows that it is in good position.  There was no pneumonia or atelectasis or pneumothorax.  Unfortunately the patient was severely tachycardic noted to be in atrial fibrillation with RVR and was started on a Cardizem infusion.  She was admitted for A. fib with RVR and acute hypoxic respiratory failure secondary to pulling out trach.    1. Chronic atrial fibrillation with rapid ventricular response-unsure if she has been taking her medications at the SNF facility regularly however she has been started on IV diltiazem infusion with goal to control rate as blood pressure tolerates.  She responded well to IV diltiazem and restarted on home dose metoprolol. She can safely discharge back to SNF.  Warfarin is subtherapeutic and dose was changed to 2 mg daily with instructions to please check PT/INR daily until therapeutic and follow closely and adjust doses as needed.  2. Squamous cell carcinoma of the glottis-there are no curative or palliative treatment options available other than placement of trach for airway patency.  This is a palliative measure only and patient understands and accepts this. 3. Cerebrovascular disease status post CVA-patient has minimal right residual deficits and remains on statin and is fully anticoagulated for A. fib with warfarin. 4. Acquired thrombophilia-Pharm.D. consulted to assist with warfarin management while in hospital.  Monitor PT/INR closely. 5. Nonischemic cardiomyopathy most recent EF 55%.  Monitor blood pressure. 6. Type 2 diabetes mellitus with vascular complications-SSI coverage ordered to follow. 7. Depression resume  home Cymbalta and bupropion. 8. GERD-Protonix ordered for GI protection. 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  Resume home ARB.  Follow blood pressures and renal function  panel. 10. Coronary artery disease-resume home statin.  DVT prophylaxis: warfarin   Code Status: Full   Family Communication at bedside  Disposition Plan: Return to [facility] called:   Admission status: OBS  Discharge Diagnoses:  Principal Problem:   Chronic atrial fibrillation with rapid ventricular response ([place]) Active Problems:   Cancer of glottis (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, NYHA class 3 (HCC)   Chronic atrial fibrillation (HCC)   Chronic kidney disease, stage III (moderate)   GERD (gastroesophageal reflux disease)   Hypertension   Type II diabetes mellitus ([place])   Cerebrovascular disease   Tracheostomy in place [facility])   DNR (do not resuscitate)   DNI (do not intubate)   Leukocytosis   Discharge Instructions:  Allergies as of 07/07/2020      Reactions   Penicillins Hives   Amoxicillin Hives   Morphine And Related    AMS, per son, she pulled out of her IV after morphine, however seems to be able to tolerate hydrocodone-acetaminophen      Medication List    STOP taking these medications   buPROPion 150 MG 24 hr tablet Commonly known as: WELLBUTRIN XL   digoxin 0.125 MG tablet Commonly known as: LANOXIN   isosorbide mononitrate 30 MG 24 hr tablet Commonly known as: IMDUR   metFORMIN 1000 MG tablet Commonly known as: GLUCOPHAGE     TAKE these medications   atorvastatin 80 MG tablet Commonly known as: LIPITOR Take 1 tablet (80 mg total) by mouth daily at 6 PM.   Calmoseptine 0.44-20.6 % Oint Generic drug: Menthol-Zinc Oxide Apply topically. Apply to buttocks topically every 8 hours prn for protection   diltiazem 30 MG tablet Commonly known as: CARDIZEM Take 1 tablet (30 mg total) by mouth daily as needed (HR>130). What changed:   when to take this  reasons to take this   DULoxetine 20 MG capsule Commonly known as: CYMBALTA Take 40 mg by mouth daily.   furosemide 40 MG  tablet Commonly known as: LASIX Take 0.5 tablets (20 mg total) by mouth daily. Start taking on: July 08, 2020   gabapentin 100 MG capsule Commonly known as: NEURONTIN Take 200 mg by mouth at bedtime.   insulin glargine 100 UNIT/ML injection Commonly known as: LANTUS Inject 10 Units into the skin at bedtime.   insulin lispro 100 UNIT/ML injection Commonly known as: HUMALOG Inject 2-10 Units into the skin 3 (three) times daily before meals.   losartan 50 MG tablet Commonly known as: COZAAR Take 0.5 tablets (25 mg total) by mouth at bedtime.   magnesium oxide 400 MG tablet Commonly known as: MAG-OX Take 400 mg by mouth 2 (two) times daily.   metoprolol tartrate 50 MG tablet Commonly known as: LOPRESSOR Take 50 mg by mouth 2 (two) times daily.   nitroGLYCERIN 0.4 MG SL tablet Commonly known as: NITROSTAT Place 1 tablet (0.4 mg total) under the tongue every 5 (five) minutes as needed for chest pain.   oxyCODONE 5 MG immediate release tablet Commonly known as: Oxy IR/ROXICODONE Take 0.5 tablets (2.5 mg total) by mouth 4 (four) times daily for 3 days.   PROSTAT PO Take 30 mLs by mouth in the morning.   sennosides-docusate sodium 8.6-50 MG tablet Commonly known  as: SENOKOT-S Take 1 tablet by mouth at bedtime.   Vitamin D 50 MCG (2000 UT) tablet Take 2,000 Units by mouth daily.   warfarin 2 MG tablet Commonly known as: COUMADIN Take 1 tablet (2 mg total) by mouth at bedtime. What changed:   medication strength  how much to take       Follow-up Information    Deanne Coffer, MD. Schedule an appointment as soon as possible for a visit in 2 week(s).   Specialty: Internal Medicine Contact information: [address]   [place] [address] [phone]              Allergies  Allergen Reactions  . Penicillins Hives  . Amoxicillin Hives  . Morphine And Related     AMS, per son, she pulled out of her IV after morphine, however seems to be able to  tolerate hydrocodone-acetaminophen   Allergies as of 07/07/2020      Reactions   Penicillins Hives   Amoxicillin Hives   Morphine And Related    AMS, per son, she pulled out of her IV after morphine, however seems to be able to tolerate hydrocodone-acetaminophen      Medication List    STOP taking these medications   buPROPion 150 MG 24 hr tablet Commonly known as: WELLBUTRIN XL   digoxin 0.125 MG tablet Commonly known as: LANOXIN   isosorbide mononitrate 30 MG 24 hr tablet Commonly known as: IMDUR   metFORMIN 1000 MG tablet Commonly known as: GLUCOPHAGE     TAKE these medications   atorvastatin 80 MG tablet Commonly known as: LIPITOR Take 1 tablet (80 mg total) by mouth daily at 6 PM.   Calmoseptine 0.44-20.6 % Oint Generic drug: Menthol-Zinc Oxide Apply topically. Apply to buttocks topically every 8 hours prn for protection   diltiazem 30 MG tablet Commonly known as: CARDIZEM Take 1 tablet (30 mg total) by mouth daily as needed (HR>130). What changed:   when to take this  reasons to take this   DULoxetine 20 MG capsule Commonly known as: CYMBALTA Take 40 mg by mouth daily.   furosemide 40 MG tablet Commonly known as: LASIX Take 0.5 tablets (20 mg total) by mouth daily. Start taking on: July 08, 2020   gabapentin 100 MG capsule Commonly known as: NEURONTIN Take 200 mg by mouth at bedtime.   insulin glargine 100 UNIT/ML injection Commonly known as: LANTUS Inject 10 Units into the skin at bedtime.   insulin lispro 100 UNIT/ML injection Commonly known as: HUMALOG Inject 2-10 Units into the skin 3 (three) times daily before meals.   losartan 50 MG tablet Commonly known as: COZAAR Take 0.5 tablets (25 mg total) by mouth at bedtime.   magnesium oxide 400 MG tablet Commonly known as: MAG-OX Take 400 mg by mouth 2 (two) times daily.   metoprolol tartrate 50 MG tablet Commonly known as: LOPRESSOR Take 50 mg by mouth 2 (two) times daily.    nitroGLYCERIN 0.4 MG SL tablet Commonly known as: NITROSTAT Place 1 tablet (0.4 mg total) under the tongue every 5 (five) minutes as needed for chest pain.   oxyCODONE 5 MG immediate release tablet Commonly known as: Oxy IR/ROXICODONE Take 0.5 tablets (2.5 mg total) by mouth 4 (four) times daily for 3 days.   PROSTAT PO Take 30 mLs by mouth in the morning.   sennosides-docusate sodium 8.6-50 MG tablet Commonly known as: SENOKOT-S Take 1 tablet by mouth at bedtime.   Vitamin D 50 MCG (  2000 UT) tablet Take 2,000 Units by mouth daily.   warfarin 2 MG tablet Commonly known as: COUMADIN Take 1 tablet (2 mg total) by mouth at bedtime. What changed:   medication strength  how much to take       Procedures/Studies: DG Chest Portable 1 View  Result Date: 07/06/2020 CLINICAL DATA:  Post et tube placement EXAM: PORTABLE CHEST 1 VIEW COMPARISON:  Chest radiograph 12/20/2014 FINDINGS: The endotracheal tube tip terminates within the right mainstem bronchus. Left ventricular pacing device in place. The mediastinal contours appear widened compared to the prior study which could be related to AP portable technique. Heart size is enlarged. Visualized portions of the lungs are clear. The patient's right hand overlies the mid right lung. No pneumothorax or significant pleural effusion. No acute finding in the visualized skeleton. IMPRESSION: 1. Endotracheal tube tip terminates within the right mainstem bronchus. Recommend repositioning. 2. Widened mediastinal contours which could be related to AP portable technique. Recommend attention on follow-up. These results were called by telephone at the time of interpretation on 07/06/2020 at 9:36 am to provider [facility] , who verbally acknowledged these results. Electronically Signed   By: Audie Pinto M.D.   On: 07/06/2020 09:37   DG Chest Port 1V same Day  Result Date: 07/06/2020 CLINICAL DATA:  Status post tracheostomy EXAM: PORTABLE CHEST 1 VIEW  COMPARISON:  07/06/2020 FINDINGS: Previously noted percutaneous endotracheal tube has been exchanged for a formal tracheostomy device which overlies its expected position at the thoracic inlet. Lungs are clear. No pneumothorax or pleural effusion. Moderate cardiomegaly is stable. Left subclavian single lead pacemaker defibrillator is in expected position. No acute bone abnormality. IMPRESSION: Tracheostomy in expected position. Electronically Signed   By: Fidela Salisbury MD   On: 07/06/2020 10:05      Subjective: Pt reports that she wants to get out of the hospital.  She has no complaints.  She is eating well.  Her trach has remained in place.    Discharge Exam: Vitals:   07/07/20 0900 07/07/20 1000  BP: (!) 79/55 (!) 103/55  Pulse: 100 85  Resp: 16 12  Temp:    SpO2: 100% 100%   Vitals:   07/07/20 0802 07/07/20 0846 07/07/20 0900 07/07/20 1000  BP:   (!) 79/55 (!) 103/55  Pulse: 87  100 85  Resp: 13  16 12   Temp:      TempSrc:      SpO2: 99% 99% 100% 100%  Weight:      Height:       General: Pt is alert, awake, not in acute distress Cardiovascular: irregularly irregular, S1/S2 +, no rubs, no gallops Respiratory: CTA bilaterally, no wheezing, no rhonchi Abdominal: Soft, NT, ND, bowel sounds + Extremities: no edema, no cyanosis   The results of significant diagnostics from this hospitalization (including imaging, microbiology, ancillary and laboratory) are listed below for reference.     Microbiology: Recent Results (from the past 240 hour(s))  SARS Coronavirus 2 by RT PCR (hospital order, performed in [facility] hospital lab) Nasopharyngeal Nasopharyngeal Swab     Status: None   Collection Time: 07/06/20  1:04 PM   Specimen: Nasopharyngeal Swab  Result Value Ref Range Status   SARS Coronavirus 2 NEGATIVE NEGATIVE Final    Comment: (NOTE) SARS-CoV-2 target nucleic acids are NOT DETECTED.  The SARS-CoV-2 RNA is generally detectable in upper and lower respiratory specimens  during the acute phase of infection. The lowest concentration of SARS-CoV-2 viral copies this assay  can detect is 250 copies / mL. A negative result does not preclude SARS-CoV-2 infection and should not be used as the sole basis for treatment or other patient management decisions.  A negative result may occur with improper specimen collection / handling, submission of specimen other than nasopharyngeal swab, presence of viral mutation(s) within the areas targeted by this assay, and inadequate number of viral copies (<250 copies / mL). A negative result must be combined with clinical observations, patient history, and epidemiological information.  Fact Sheet for Patients:   StrictlyIdeas.no  Fact Sheet for Healthcare Providers: BankingDealers.co.za  This test is not yet approved or  cleared by the [place] FDA and has been authorized for detection and/or diagnosis of SARS-CoV-2 by FDA under an Emergency Use Authorization (EUA).  This EUA will remain in effect (meaning this test can be used) for the duration of the COVID-19 declaration under Section 564(b)(1) of the Act, 21 U.S.C. section 360bbb-3(b)(1), unless the authorization is terminated or revoked sooner.  Performed at [facility], [address], [place], [place] [address]   MRSA PCR Screening     Status: Abnormal   Collection Time: 07/06/20  9:06 PM   Specimen: Nasal Mucosa; Nasopharyngeal  Result Value Ref Range Status   MRSA by PCR POSITIVE (A) NEGATIVE Final    Comment:        The GeneXpert MRSA Assay (FDA approved for NASAL specimens only), is one component of a comprehensive MRSA colonization surveillance program. It is not intended to diagnose MRSA infection nor to guide or monitor treatment for MRSA infections. RESULT CALLED TO, READ BACK BY AND VERIFIED WITH: SANDY RN @ [phone] ON [id number] BY HENDERSON L. Performed at [facility], [address], [place],   [address]      Labs: BNP (last 3 results) No results for input(s): BNP in the last 8760 hours. Basic Metabolic Panel: Recent Labs  Lab 07/06/20 0924 07/07/20 0453  NA 140 136  K 3.8 3.7  CL 104 103  CO2 26 28  GLUCOSE 94 119*  BUN 26* 30*  CREATININE 1.63* 1.41*  CALCIUM 9.6 8.7*  MG  --  1.7   Liver Function Tests: Recent Labs  Lab 07/06/20 0924 07/07/20 0453  AST 24 17  ALT 15 12  ALKPHOS 88 73  BILITOT 0.9 0.7  PROT 6.9 5.4*  ALBUMIN 2.8* 2.2*   No results for input(s): LIPASE, AMYLASE in the last 168 hours. No results for input(s): AMMONIA in the last 168 hours. CBC: Recent Labs  Lab 07/06/20 0924 07/07/20 0453  WBC 13.2* 7.3  NEUTROABS 6.7 3.8  HGB 13.0 10.0*  HCT 39.9 32.2*  MCV 87.7 86.6  PLT 316 228   Cardiac Enzymes: No results for input(s): CKTOTAL, CKMB, CKMBINDEX, TROPONINI in the last 168 hours. BNP: Invalid input(s): POCBNP CBG: Recent Labs  Lab 07/06/20 2219 07/07/20 0326 07/07/20 0726  GLUCAP 149* 123* 79   D-Dimer No results for input(s): DDIMER in the last 72 hours. Hgb A1c Recent Labs    07/06/20 0924  HGBA1C 5.9*   Lipid Profile No results for input(s): CHOL, HDL, LDLCALC, TRIG, CHOLHDL, LDLDIRECT in the last 72 hours. Thyroid function studies No results for input(s): TSH, T4TOTAL, T3FREE, THYROIDAB in the last 72 hours.  Invalid input(s): FREET3 Anemia work up No results for input(s): VITAMINB12, FOLATE, FERRITIN, TIBC, IRON, RETICCTPCT in the last 72 hours. Urinalysis No results found for: COLORURINE, APPEARANCEUR, LABSPEC, PHURINE, GLUCOSEU, HGBUR, BILIRUBINUR, KETONESUR, PROTEINUR, UROBILINOGEN, NITRITE, LEUKOCYTESUR Sepsis  Labs Invalid input(s): PROCALCITONIN,  WBC,  LACTICIDVEN Microbiology Recent Results (from the past 240 hour(s))  SARS Coronavirus 2 by RT PCR (hospital order, performed in [facility] hospital lab) Nasopharyngeal Nasopharyngeal Swab     Status: None   Collection Time: 07/06/20  1:04 PM    Specimen: Nasopharyngeal Swab  Result Value Ref Range Status   SARS Coronavirus 2 NEGATIVE NEGATIVE Final    Comment: (NOTE) SARS-CoV-2 target nucleic acids are NOT DETECTED.  The SARS-CoV-2 RNA is generally detectable in upper and lower respiratory specimens during the acute phase of infection. The lowest concentration of SARS-CoV-2 viral copies this assay can detect is 250 copies / mL. A negative result does not preclude SARS-CoV-2 infection and should not be used as the sole basis for treatment or other patient management decisions.  A negative result may occur with improper specimen collection / handling, submission of specimen other than nasopharyngeal swab, presence of viral mutation(s) within the areas targeted by this assay, and inadequate number of viral copies (<250 copies / mL). A negative result must be combined with clinical observations, patient history, and epidemiological information.  Fact Sheet for Patients:   StrictlyIdeas.no  Fact Sheet for Healthcare Providers: BankingDealers.co.za  This test is not yet approved or  cleared by the [place] FDA and has been authorized for detection and/or diagnosis of SARS-CoV-2 by FDA under an Emergency Use Authorization (EUA).  This EUA will remain in effect (meaning this test can be used) for the duration of the COVID-19 declaration under Section 564(b)(1) of the Act, 21 U.S.C. section 360bbb-3(b)(1), unless the authorization is terminated or revoked sooner.  Performed at [facility], [address], [place], [place] [address]   MRSA PCR Screening     Status: Abnormal   Collection Time: 07/06/20  9:06 PM   Specimen: Nasal Mucosa; Nasopharyngeal  Result Value Ref Range Status   MRSA by PCR POSITIVE (A) NEGATIVE Final    Comment:        The GeneXpert MRSA Assay (FDA approved for NASAL specimens only), is one component of a comprehensive MRSA colonization surveillance  program. It is not intended to diagnose MRSA infection nor to guide or monitor treatment for MRSA infections. RESULT CALLED TO, READ BACK BY AND VERIFIED WITH: SANDY RN @ [phone] ON [id number] BY HENDERSON L. Performed at [facility], [address], [place], [place] [address]     SIGNED:  Irwin Brakeman, MD  [facility] 07/07/2020, 11:00 AM How to contact the [facility] Attending or Consulting provider [place] or covering provider during after hours [place], for this patient?  1. Check the care team in [facility] and look for a) attending/consulting [facility] provider listed and b) the [facility] team listed 2. Log into [email or web address] and use [place]'s universal password to access. If you do not have the password, please contact the hospital operator. 3. Locate the [facility] provider you are looking for under [facility] and page to a number that you can be directly reached. 4. If you still have difficulty reaching the provider, please page the [facility] (Director on Call) for the Hospitalists listed on amion for assistance.

## 2020-07-07 NOTE — TOC Initial Note (Signed)
Transition of Care [facility]) - Initial/Assessment Note    Patient Details  Name: Angel Allen MRN: [id number] Date of Birth: 1946/08/20  Transition of Care [facility]) CM/SW Contact:    Salome Arnt, [place] Phone Number: 07/07/2020, 9:04 AM  Clinical Narrative:   Pt brought to hospital due pulling out trach at SNF. Pt has been resident at [facility] for about 2 months. Her son, Darnell Level is HCPOA. LCSW spoke with Bruce who states that plan is to return to [facility] when medically stable. He was concerned that he did not receive a call from SNF about pt going to hospital. Bruce plans to discuss with [facility] today. Per Melissa at facility, pt is long term and okay to return. Will continue to follow and update SNF.                  Expected Discharge Plan: Long Term Nursing Home Barriers to Discharge: Continued Medical Work up   Patient Goals and CMS Choice Patient states their goals for this hospitalization and ongoing recovery are:: return to [facility]      Expected Discharge Plan and Services Expected Discharge Plan: [place] In-house Referral: Clinical Social Work     Living arrangements for the past 2 months: [place]                                      Prior Living Arrangements/Services Living arrangements for the past 2 months: [place] Lives with:: Facility Resident Patient language and need for interpreter reviewed:: Yes Do you feel safe going back to the place where you live?: Yes      Need for Family Participation in Patient Care: Yes (Comment) Care giver support system in place?: Yes (comment)   Criminal Activity/Legal Involvement Pertinent to Current Situation/Hospitalization: No - Comment as needed  Activities of Daily Living Home Assistive Devices/Equipment: None ADL Screening (condition at time of admission) Patient's cognitive ability adequate to safely complete daily activities?: Yes Is the  patient deaf or have difficulty hearing?: No Does the patient have difficulty seeing, even when wearing glasses/contacts?: No Does the patient have difficulty concentrating, remembering, or making decisions?: No Patient able to express need for assistance with ADLs?: Yes Does the patient have difficulty dressing or bathing?: Yes Independently performs ADLs?: No Communication: Independent Dressing (OT): Dependent Is this a change from baseline?: Pre-admission baseline Grooming: Dependent Is this a change from baseline?: Pre-admission baseline Feeding: Independent Bathing: Dependent Is this a change from baseline?: Pre-admission baseline Toileting: Dependent Is this a change from baseline?: Pre-admission baseline In/Out Bed: Dependent Is this a change from baseline?: Pre-admission baseline Walks in Home: Dependent Is this a change from baseline?: Pre-admission baseline Does the patient have difficulty walking or climbing stairs?: Yes Weakness of Legs: Both Weakness of Arms/Hands: Both  Permission Sought/Granted   Permission granted to share information with : Yes, Verbal Permission Granted     Permission granted to share info w AGENCY: [employer] granted to share info w Relationship: facility     Emotional Assessment Appearance:: Appears stated age Attitude/Demeanor/Rapport: Unable to Assess     Alcohol / Substance Use: Not Applicable Psych Involvement: No (comment)  Admission diagnosis:  Tracheostomy mechanical complication ([place]) [O16.07] Atrial fibrillation with rapid ventricular response ([place]) [I48.91] Encounter for intubation [Z01.818] Acute respiratory failure with hypoxia ([place]) [J96.01] Chronic atrial fibrillation with rapid ventricular  response ([place]) [I48.20] Patient Active Problem List   Diagnosis Date Noted  . Chronic atrial fibrillation with rapid ventricular response ([place]) 07/06/2020  . GERD (gastroesophageal reflux disease)   . Hypertension    . Type II diabetes mellitus ([place])   . Acute on chronic respiratory failure with hypoxia ([place])   . 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 Chronic atrial fibrillation ([place])   . Chronic kidney disease, stage III (moderate)   . Cerebrovascular disease 2009  . 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 DNR (do not resuscitate) 2009  . DNI (do not intubate) 2009  . Leukocytosis 2009   PCP:  Deanne Coffer, MD Pharmacy:   [place], [place] - [address] [place] [address] Phone: [phone] Fax: [phone]     Social Determinants of Health (SDOH) Interventions    Readmission Risk Interventions Readmission Risk Prevention Plan 07/07/2020  Transportation Screening Complete  PCP or Specialist Appt within 3-5 Days Complete  HRI or [place] Complete  Social Work Consult for [place] Planning/Counseling Complete  Palliative Care Screening Not Applicable  Medication Review Press photographer) Complete  Some recent data might be hidden

## 2020-07-07 NOTE — NC FL2 (Signed)
[place] MEDICAID FL2 LEVEL OF CARE SCREENING TOOL     IDENTIFICATION  Patient Name: Angel Allen Birthdate: 1946/05/14 Sex: female Admission Date (Current Location): 07/06/2020  [facility] and [place] Number:  [employer] and Address:  [place] [address], [place]      Provider Number: [id number]  Attending Physician Name and Address:  Murlean Iba, MD  Relative Name and Phone Number:       Current Level of Care: Hospital Recommended Level of Care: Nursing Facility Prior Approval Number:    Date Approved/Denied:   PASRR Number:    Discharge Plan: SNF    Current Diagnoses: Patient Active Problem List   Diagnosis Date Noted  . Chronic atrial fibrillation with rapid ventricular response ([place]) 07/06/2020  . GERD (gastroesophageal reflux disease)   . Hypertension   . Type II diabetes mellitus ([place])   . Acute on chronic respiratory failure with hypoxia ([place])   . 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 Chronic atrial fibrillation ([place])   . Chronic kidney disease, stage III (moderate)   . Cerebrovascular disease 2009  . 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 DNR (do not resuscitate) 2009  . DNI (do not intubate) 2009  . Leukocytosis 2009    Orientation RESPIRATION BLADDER Height & Weight     Self, Place  Tracheostomy, O2 (5L) External catheter Weight: 199 lb 4.7 oz (90.4 kg) Height:  5\' 5"  (165.1 cm)  BEHAVIORAL SYMPTOMS/MOOD NEUROLOGICAL BOWEL NUTRITION STATUS      Incontinent Diet (Dysphagia 3 with thin liquids. See d/c summary for updates.)  AMBULATORY STATUS COMMUNICATION OF NEEDS Skin   Extensive Assist Verbally (difficult due to trach) Other (Comment) (non-pressure wound to toe)                       Personal Care Assistance Level of Assistance  Bathing, Feeding, Dressing Bathing Assistance: Maximum assistance Feeding  assistance: Limited assistance Dressing Assistance: Maximum assistance     Functional Limitations Info  Sight, Hearing, Speech Sight Info: Adequate Hearing Info: Adequate Speech Info: Impaired    SPECIAL CARE FACTORS FREQUENCY                       Contractures      Additional Factors Info  Allergies, Code Status, Psychotropic, Insulin Sliding Scale, Suctioning Needs Code Status Info: DNR Allergies Info: Penicillins, Amoxicillin, Morphine and related Psychotropic Info: Wellbutrin, Cymbalta Insulin Sliding Scale Info: 3 times daily before meals.   Suctioning Needs: as needed. see d/c summary for updates   Current Medications (07/07/2020):  This is the current hospital active medication list Current Facility-Administered Medications  Medication Dose Route Frequency Provider Last Rate Last Admin  . acetaminophen (TYLENOL) tablet 650 mg  650 mg Oral Q6H PRN Johnson, Clanford L, MD       Or  . acetaminophen (TYLENOL) suppository 650 mg  650 mg Rectal Q6H PRN Johnson, Clanford L, MD      . atorvastatin (LIPITOR) tablet 80 mg  80 mg Oral q1800 Johnson, Clanford L, MD      . chlorhexidine (PERIDEX) 0.12 % solution 15 mL  15 mL Mouth Rinse BID Johnson, Clanford L, MD   15 mL at 07/07/20 0842  . Chlorhexidine Gluconate Cloth 2 % PADS 6 each  6 each Topical Q0600 Murlean Iba, MD  6 each at 07/07/20 0732  . cholecalciferol (VITAMIN D3) tablet 2,000 Units  2,000 Units Oral Daily Johnson, Clanford L, MD   2,000 Units at 07/07/20 0900  . diltiazem (CARDIZEM) 125 mg in dextrose 5% 125 mL (1 mg/mL) infusion  5-15 mg/hr Intravenous Continuous Murlean Iba, MD   Stopped at 07/07/20 [phone]  . DULoxetine (CYMBALTA) DR capsule 40 mg  40 mg Oral Daily Johnson, Clanford L, MD   40 mg at 07/07/20 0900  . gabapentin (NEURONTIN) capsule 200 mg  200 mg Oral QHS Johnson, Clanford L, MD   200 mg at 07/06/20 2223  . insulin aspart (novoLOG) injection 0-15 Units  0-15 Units Subcutaneous TID  WC Johnson, Clanford L, MD      . insulin aspart (novoLOG) injection 2 Units  2 Units Subcutaneous TID WC Johnson, Clanford L, MD      . insulin glargine (LANTUS) injection 10 Units  10 Units Subcutaneous QHS Wynetta Emery, Clanford L, MD   10 Units at 07/06/20 2305  . magnesium oxide (MAG-OX) tablet 400 mg  400 mg Oral BID Johnson, Clanford L, MD   400 mg at 07/07/20 0900  . MEDLINE mouth rinse  15 mL Mouth Rinse q12n4p Johnson, Clanford L, MD      . metoprolol tartrate (LOPRESSOR) tablet 50 mg  50 mg Oral BID Johnson, Clanford L, MD   50 mg at 07/07/20 0900  . mupirocin ointment (BACTROBAN) 2 % 1 application  1 application Nasal BID Wynetta Emery, Clanford L, MD   1 application at 44/62/86 0900  . nitroGLYCERIN (NITROSTAT) SL tablet 0.4 mg  0.4 mg Sublingual Q5 min PRN Johnson, Clanford L, MD      . ondansetron (ZOFRAN) tablet 4 mg  4 mg Oral Q6H PRN Johnson, Clanford L, MD       Or  . ondansetron (ZOFRAN) injection 4 mg  4 mg Intravenous Q6H PRN Johnson, Clanford L, MD      . oxyCODONE (Oxy IR/ROXICODONE) immediate release tablet 2.5 mg  2.5 mg Oral QID Johnson, Clanford L, MD   2.5 mg at 07/07/20 0900  . pantoprazole (PROTONIX) EC tablet 40 mg  40 mg Oral Q0600 Johnson, Clanford L, MD   40 mg at 07/07/20 0548  . senna-docusate (Senokot-S) tablet 1 tablet  1 tablet Oral QHS Johnson, Clanford L, MD   1 tablet at 07/06/20 2223  . Warfarin - Pharmacist Dosing Inpatient   Does not apply q1600 Murlean Iba, MD         Discharge Medications: Please see discharge summary for a list of discharge medications.  Relevant Imaging Results:  Relevant Lab Results:   Additional Information    Salome Arnt, LCSW

## 2020-07-07 NOTE — Care Management CC44 (Signed)
Condition Code 44 Documentation Completed  Patient Details  Name: Keyarah Mcroy MRN: [id number] Date of Birth: 02/09/1946   Condition Code 44 given:  Yes Patient signature on Condition Code 44 notice:  Yes (verbal) Documentation of 2 MD's agreement:  Yes Code 44 added to claim:  Yes    Salome Arnt, [place] 07/07/2020, 11:49 AM

## 2020-07-07 NOTE — TOC Transition Note (Signed)
Transition of Care [facility]) - CM/SW Discharge Note   Patient Details  Name: Angel Allen MRN: [id number] Date of Birth: [birth year]  Transition of Care [facility]) CM/SW Contact:  Salome Arnt, [place] Phone Number: 07/07/2020, 11:57 AM   Clinical Narrative:  Pt d/c today back to [facility]. Pt's son and facility aware and agreeable. Clinicals sent on hub. Pt will transport via [place] has called.      Final next level of care: Long Term Nursing Home Barriers to Discharge: Barriers Resolved   Patient Goals and CMS Choice Patient states their goals for this hospitalization and ongoing recovery are:: return to [facility]      Discharge Placement              Patient chooses bed at: [facility] Patient to be transferred to facility by: [facility] Name of family member notified: Darnell Level- son Patient and family notified of of transfer: 07/07/20  Discharge Plan and Services In-house Referral: Clinical Social Work                                   Social Determinants of Health (SDOH) Interventions     Readmission Risk Interventions Readmission Risk Prevention Plan 07/07/2020  Transportation Screening Complete  PCP or Specialist Appt within 3-5 Days Complete  HRI or [place] Complete  Social Work Consult for [place] Planning/Counseling Complete  Palliative Care Screening Not Applicable  Medication Review Press photographer) Complete  Some recent data might be hidden

## 2020-07-07 NOTE — Discharge Instructions (Signed)
IMPORTANT INFORMATION: PAY CLOSE ATTENTION   PHYSICIAN DISCHARGE INSTRUCTIONS  Follow with Primary care provider  Deanne Coffer, MD  and other consultants as instructed by your Hospitalist Physician  [place] IF SYMPTOMS COME BACK, WORSEN OR NEW PROBLEM DEVELOPS   Please note: You were cared for by a hospitalist during your hospital stay. Every effort will be made to forward records to your primary care provider.  You can request that your primary care provider send for your hospital records if they have not received them.  Once you are discharged, your primary care physician will handle any further medical issues. Please note that NO REFILLS for any discharge medications will be authorized once you are discharged, as it is imperative that you return to your primary care physician (or establish a relationship with a primary care physician if you do not have one) for your post hospital discharge needs so that they can reassess your need for medications and monitor your lab values.  Please get a complete blood count and chemistry panel checked by your Primary MD at your next visit, and again as instructed by your Primary MD.  Get Medicines reviewed and adjusted: Please take all your medications with you for your next visit with your Primary MD  Laboratory/radiological data: Please request your Primary MD to go over all hospital tests and procedure/radiological results at the follow up, please ask your primary care provider to get all Hospital records sent to his/her office.  In some cases, they will be blood work, cultures and biopsy results pending at the time of your discharge. Please request that your primary care provider follow up on these results.  If you are diabetic, please bring your blood sugar readings with you to your follow up appointment with primary care.    Please call and make your follow up appointments as soon as possible.    Also Note  the following: If you experience worsening of your admission symptoms, develop shortness of breath, life threatening emergency, suicidal or homicidal thoughts you must seek medical attention immediately by calling 911 or calling your MD immediately  if symptoms less severe.  You must read complete instructions/literature along with all the possible adverse reactions/side effects for all the Medicines you take and that have been prescribed to you. Take any new Medicines after you have completely understood and accpet all the possible adverse reactions/side effects.   Do not drive when taking Pain medications or sleeping medications (Benzodiazepines)  Do not take more than prescribed Pain, Sleep and Anxiety Medications. It is not advisable to combine anxiety,sleep and pain medications without talking with your primary care practitioner  Special Instructions: If you have smoked or chewed Tobacco  in the last 2 yrs please stop smoking, stop any regular Alcohol  and or any Recreational drug use.  Wear Seat belts while driving.  Do not drive if taking any narcotic, mind altering or controlled substances or recreational drugs or alcohol.

## 2020-07-07 NOTE — Progress Notes (Signed)
[facility] received patient at 1515. Report called and given to Velna Hatchet, Therapist, sports at [facility].

## 2020-07-11 MED FILL — Medication: Qty: 1 | Status: AC

## 2020-07-19 ENCOUNTER — Other Ambulatory Visit: Payer: Self-pay

## 2020-07-19 ENCOUNTER — Ambulatory Visit (HOSPITAL_COMMUNITY)
Admission: RE | Admit: 2020-07-19 | Discharge: 2020-07-19 | Disposition: A | Discharge status: Home / Self Care | Payer: Medicare Other | Source: Ambulatory Visit | Attending: Acute Care | Admitting: Acute Care

## 2020-07-19 DIAGNOSIS — E1122 Type 2 diabetes mellitus with diabetic chronic kidney disease: Secondary | ICD-10-CM | POA: Insufficient documentation

## 2020-07-19 DIAGNOSIS — Z880 Allergy status to penicillin: Secondary | ICD-10-CM | POA: Insufficient documentation

## 2020-07-19 DIAGNOSIS — Z430 Encounter for attention to tracheostomy: Secondary | ICD-10-CM | POA: Insufficient documentation

## 2020-07-19 DIAGNOSIS — Z930 Tracheostomy status: Secondary | ICD-10-CM | POA: Insufficient documentation

## 2020-07-19 DIAGNOSIS — I428 Other cardiomyopathies: Secondary | ICD-10-CM | POA: Insufficient documentation

## 2020-07-19 DIAGNOSIS — N1832 Chronic kidney disease, stage 3b: Secondary | ICD-10-CM | POA: Diagnosis not present

## 2020-07-19 DIAGNOSIS — K219 Gastro-esophageal reflux disease without esophagitis: Secondary | ICD-10-CM | POA: Insufficient documentation

## 2020-07-19 DIAGNOSIS — I482 Chronic atrial fibrillation, unspecified: Secondary | ICD-10-CM | POA: Diagnosis not present

## 2020-07-19 DIAGNOSIS — R062 Wheezing: Secondary | ICD-10-CM | POA: Insufficient documentation

## 2020-07-19 DIAGNOSIS — I69851 Hemiplegia and hemiparesis following other cerebrovascular disease affecting right dominant side: Secondary | ICD-10-CM | POA: Insufficient documentation

## 2020-07-19 DIAGNOSIS — Z8521 Personal history of malignant neoplasm of larynx: Secondary | ICD-10-CM | POA: Insufficient documentation

## 2020-07-19 NOTE — Progress Notes (Signed)
Tracheostomy Procedure Note  Angel Allen [id number] [birth year]  Pre Procedure Tracheostomy Information  Trach Brand: Shiley Size: 4.0 Style: Uncuffed Secured by: Velcro   Procedure: Trach Cleaning and  Trach change    Post Procedure Tracheostomy Information  Trach Brand: Shiley Size: 4.0 Style: Uncuffed Secured by: Velcro   Post Procedure Evaluation:  ETCO2 positive color change from yellow to purple : Yes.   Vital signs:VSS, pulse 92, respirations 18 and pulse oximetry 100 % on 5 lpm via trach collar Patients current condition: stable Complications: No apparent complications Trach site exam: clean and dry Wound care done: 4 x 4 gauze Patient did tolerate procedure well.   Education: none  Prescription needs: none    Additional needs: PMV instructions sent back with patient to Facility

## 2020-07-19 NOTE — Progress Notes (Signed)
[facility]   Reason for visit:  Trach care HPI:  74 year old female who resides at skilled nursing facility.  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  This was done remotely at [facility], she is tracheostomy dependent because of this, recently hospitalized From 7/8-7/9 after pulling out her tracheostomy.  Also found to have atrial fibrillation with rapid ventricular response she was treated overnight and released back to the nursing home.  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  Chronic atrial fibrillation, nonischemic cardiomyopathy with EF 55%, type 2 diabetes, depression, GERD, stage IIIb CKD.  Social history/family history  Resides at skilled nursing facility, family history is positive for coronary artery disease as well as Alzheimer's  Allergies  Penicillin and amoxicillin given her hives, morphine caused confusion ROS  Review of Systems -denies headache, nasal congestion, sore throat, increased tracheal drainage or postnasal drip, denies trach site pain, change in cough, denies shortness of breath, chest pain, wheezing, frequent suctioning.  Denies lower extremity edema. Vital signs:  Reviewed,Respiratory rate 92 respirations 18 saturations 100% 5 L trach collar Exam:  General: 74 year old female patient resting in transport stretcher. HEENT normocephalic atraumatic no jugular venous distention is appreciated.  Size 4 tracheostomy in place this is uncuffed.  Tracheostomy stoma slightly erythemic with thin secretions expectorated with cough.She is able to phonate, but does have audible inspiratory and expiratory wheeze both with PMV in place and with PMV  removed.  She does tolerated PMV placement however phonation quality is very weak, and audible at whisper level at best Pulmonary: Clear to auscultation diminished bases does have occasional rhonchi that clear with suctioning Cardiac regular rate and rhythm Abdomen soft nontender no organomegaly Extremities are warm and dry with brisk capillary refill Neuro awake oriented no focal deficits and appropriate at time of exam Trach change/procedure: The size 4 cuffless tracheostomy was removed, tracheostomy site examined and noted to be erythemic but otherwise unremarkable.  The site was cleaned, a new size 4 cuffless tracheostomy was placed without difficulty placement checked with end-tidal CO2.     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  I do not think Ms. Cariker is a candidate for decannulation given poor phonation quality, and concern for tracheal stenosis as evidenced by inspiratory and expiratory wheezing.  I do think she can tolerate supervised PMV and should do so is much as possible however I would not recommend unsupervised PMV given her functional limitations and audible wheezing Plan  Continue routine tracheostomy care Encourage supervised PMV to facilitate communication Return to tracheostomy clinic in 12 weeks for planned routine tracheostomy change  I do not think she is a candidate for decannulation this will be a long-term tracheostomy.   Visit time: 33 minutes.   Erick Colace ACNP-BC [place]

## 2020-08-01 ENCOUNTER — Inpatient Hospital Stay (HOSPITAL_COMMUNITY)
Admission: EM | Admit: 2020-08-01 | Discharge: 2020-08-09 | DRG: 205 | Disposition: A | Discharge status: Hospice | Payer: Medicare Other | Source: Skilled Nursing Facility | Attending: Internal Medicine | Admitting: Internal Medicine

## 2020-08-01 ENCOUNTER — Other Ambulatory Visit: Payer: Self-pay

## 2020-08-01 ENCOUNTER — Emergency Department (HOSPITAL_COMMUNITY): Payer: Medicare Other

## 2020-08-01 ENCOUNTER — Encounter (HOSPITAL_COMMUNITY): Payer: Self-pay | Admitting: Emergency Medicine

## 2020-08-01 DIAGNOSIS — E11649 Type 2 diabetes mellitus with hypoglycemia without coma: Secondary | ICD-10-CM | POA: Diagnosis not present

## 2020-08-01 DIAGNOSIS — J449 Chronic obstructive pulmonary disease, unspecified: Secondary | ICD-10-CM | POA: Diagnosis not present

## 2020-08-01 DIAGNOSIS — J9621 Acute and chronic respiratory failure with hypoxia: Secondary | ICD-10-CM | POA: Diagnosis not present

## 2020-08-01 DIAGNOSIS — E785 Hyperlipidemia, unspecified: Secondary | ICD-10-CM | POA: Diagnosis not present

## 2020-08-01 DIAGNOSIS — I4891 Unspecified atrial fibrillation: Secondary | ICD-10-CM

## 2020-08-01 DIAGNOSIS — E876 Hypokalemia: Secondary | ICD-10-CM | POA: Diagnosis present

## 2020-08-01 DIAGNOSIS — D649 Anemia, unspecified: Secondary | ICD-10-CM | POA: Diagnosis present

## 2020-08-01 DIAGNOSIS — Z515 Encounter for palliative care: Secondary | ICD-10-CM | POA: Diagnosis not present

## 2020-08-01 DIAGNOSIS — Z20822 Contact with and (suspected) exposure to covid-19: Secondary | ICD-10-CM | POA: Diagnosis not present

## 2020-08-01 DIAGNOSIS — Z930 Tracheostomy status: Secondary | ICD-10-CM

## 2020-08-01 DIAGNOSIS — I130 Hypertensive heart and chronic kidney disease with heart failure and stage 1 through stage 4 chronic kidney disease, or unspecified chronic kidney disease: Secondary | ICD-10-CM | POA: Diagnosis present

## 2020-08-01 DIAGNOSIS — C320 Malignant neoplasm of glottis: Secondary | ICD-10-CM | POA: Diagnosis present

## 2020-08-01 DIAGNOSIS — Z95810 Presence of automatic (implantable) cardiac defibrillator: Secondary | ICD-10-CM

## 2020-08-01 DIAGNOSIS — I5022 Chronic systolic (congestive) heart failure: Secondary | ICD-10-CM | POA: Diagnosis not present

## 2020-08-01 DIAGNOSIS — J9503 Malfunction of tracheostomy stoma: Principal | ICD-10-CM

## 2020-08-01 DIAGNOSIS — Z7901 Long term (current) use of anticoagulants: Secondary | ICD-10-CM

## 2020-08-01 DIAGNOSIS — Z66 Do not resuscitate: Secondary | ICD-10-CM | POA: Diagnosis present

## 2020-08-01 DIAGNOSIS — Z794 Long term (current) use of insulin: Secondary | ICD-10-CM | POA: Diagnosis not present

## 2020-08-01 DIAGNOSIS — E1122 Type 2 diabetes mellitus with diabetic chronic kidney disease: Secondary | ICD-10-CM | POA: Diagnosis present

## 2020-08-01 DIAGNOSIS — Z789 Other specified health status: Secondary | ICD-10-CM | POA: Diagnosis present

## 2020-08-01 DIAGNOSIS — R0989 Other specified symptoms and signs involving the circulatory and respiratory systems: Secondary | ICD-10-CM

## 2020-08-01 DIAGNOSIS — I482 Chronic atrial fibrillation, unspecified: Secondary | ICD-10-CM | POA: Diagnosis present

## 2020-08-01 DIAGNOSIS — N1832 Chronic kidney disease, stage 3b: Secondary | ICD-10-CM | POA: Diagnosis not present

## 2020-08-01 DIAGNOSIS — Z820 Family history of epilepsy and other diseases of the nervous system: Secondary | ICD-10-CM | POA: Diagnosis not present

## 2020-08-01 DIAGNOSIS — Z87891 Personal history of nicotine dependence: Secondary | ICD-10-CM | POA: Diagnosis not present

## 2020-08-01 DIAGNOSIS — Z880 Allergy status to penicillin: Secondary | ICD-10-CM | POA: Diagnosis not present

## 2020-08-01 DIAGNOSIS — N183 Chronic kidney disease, stage 3 unspecified: Secondary | ICD-10-CM | POA: Diagnosis present

## 2020-08-01 DIAGNOSIS — Z7189 Other specified counseling: Secondary | ICD-10-CM

## 2020-08-01 DIAGNOSIS — I69351 Hemiplegia and hemiparesis following cerebral infarction affecting right dominant side: Secondary | ICD-10-CM

## 2020-08-01 DIAGNOSIS — J398 Other specified diseases of upper respiratory tract: Secondary | ICD-10-CM | POA: Diagnosis present

## 2020-08-01 DIAGNOSIS — R5383 Other fatigue: Secondary | ICD-10-CM

## 2020-08-01 LAB — CBC WITH DIFFERENTIAL/PLATELET
Abs Immature Granulocytes: 0.03 10*3/uL (ref 0.00–0.07)
Basophils Absolute: 0 10*3/uL (ref 0.0–0.1)
Basophils Relative: 1 %
Eosinophils Absolute: 0.2 10*3/uL (ref 0.0–0.5)
Eosinophils Relative: 3 %
HCT: 32.9 % — ABNORMAL LOW (ref 36.0–46.0)
Hemoglobin: 10.4 g/dL — ABNORMAL LOW (ref 12.0–15.0)
Immature Granulocytes: 0 %
Lymphocytes Relative: 25 %
Lymphs Abs: 2.2 10*3/uL (ref 0.7–4.0)
MCH: 27.2 pg (ref 26.0–34.0)
MCHC: 31.6 g/dL (ref 30.0–36.0)
MCV: 86.1 fL (ref 80.0–100.0)
Monocytes Absolute: 1.2 10*3/uL — ABNORMAL HIGH (ref 0.1–1.0)
Monocytes Relative: 14 %
Neutro Abs: 5.1 10*3/uL (ref 1.7–7.7)
Neutrophils Relative %: 57 %
Platelets: 260 10*3/uL (ref 150–400)
RBC: 3.82 MIL/uL — ABNORMAL LOW (ref 3.87–5.11)
RDW: 17.6 % — ABNORMAL HIGH (ref 11.5–15.5)
WBC: 8.9 10*3/uL (ref 4.0–10.5)
nRBC: 0 % (ref 0.0–0.2)

## 2020-08-01 LAB — BASIC METABOLIC PANEL
Anion gap: 9 (ref 5–15)
BUN: 17 mg/dL (ref 8–23)
CO2: 25 mmol/L (ref 22–32)
Calcium: 8.6 mg/dL — ABNORMAL LOW (ref 8.9–10.3)
Chloride: 107 mmol/L (ref 98–111)
Creatinine, Ser: 1.32 mg/dL — ABNORMAL HIGH (ref 0.44–1.00)
GFR calc Af Amer: 45 mL/min — ABNORMAL LOW (ref >60)
GFR calc non Af Amer: 39 mL/min — ABNORMAL LOW (ref >60)
Glucose, Bld: 98 mg/dL (ref 70–99)
Potassium: 3.3 mmol/L — ABNORMAL LOW (ref 3.5–5.1)
Sodium: 141 mmol/L (ref 135–145)

## 2020-08-01 LAB — SARS CORONAVIRUS 2 BY RT PCR (HOSPITAL ORDER, PERFORMED IN ~~LOC~~ HOSPITAL LAB): SARS Coronavirus 2: NEGATIVE

## 2020-08-01 LAB — TROPONIN I (HIGH SENSITIVITY)
Troponin I (High Sensitivity): 13 ng/L (ref <18)
Troponin I (High Sensitivity): 13 ng/L (ref <18)

## 2020-08-01 LAB — MAGNESIUM: Magnesium: 1.4 mg/dL — ABNORMAL LOW (ref 1.7–2.4)

## 2020-08-01 LAB — PROTIME-INR
INR: 1.5 — ABNORMAL HIGH (ref 0.8–1.2)
Prothrombin Time: 17.4 seconds — ABNORMAL HIGH (ref 11.4–15.2)

## 2020-08-01 LAB — TSH: TSH: 1.229 u[IU]/mL (ref 0.350–4.500)

## 2020-08-01 LAB — GLUCOSE, CAPILLARY: Glucose-Capillary: 113 mg/dL — ABNORMAL HIGH (ref 70–99)

## 2020-08-01 MED ORDER — GABAPENTIN 100 MG PO CAPS
200.0000 mg | ORAL_CAPSULE | Freq: Every day | ORAL | Status: DC
  Administered 2020-08-06 – 2020-08-08 (×3): 200 mg via ORAL
  Filled 2020-08-01 (×5): qty 2

## 2020-08-01 MED ORDER — TRAZODONE HCL 50 MG PO TABS
50.0000 mg | ORAL_TABLET | Freq: Every evening | ORAL | Status: DC | PRN
  Administered 2020-08-06: 50 mg via ORAL
  Filled 2020-08-01 (×2): qty 1

## 2020-08-01 MED ORDER — POLYETHYLENE GLYCOL 3350 17 G PO PACK: 17.0000 g | PACK | Freq: Every day | ORAL | Status: DC | PRN

## 2020-08-01 MED ORDER — METOPROLOL TARTRATE 50 MG PO TABS
50.0000 mg | ORAL_TABLET | Freq: Once | ORAL | Status: AC
  Administered 2020-08-01: 50 mg via ORAL
  Filled 2020-08-01: qty 1

## 2020-08-01 MED ORDER — SODIUM CHLORIDE 0.9% FLUSH: 3.0000 mL | INTRAVENOUS | Status: DC | PRN

## 2020-08-01 MED ORDER — SENNOSIDES-DOCUSATE SODIUM 8.6-50 MG PO TABS
1.0000 | ORAL_TABLET | Freq: Every day | ORAL | Status: DC
  Administered 2020-08-07: 1 via ORAL
  Filled 2020-08-01 (×3): qty 1

## 2020-08-01 MED ORDER — FENTANYL CITRATE (PF) 100 MCG/2ML IJ SOLN
12.5000 ug | INTRAMUSCULAR | Status: DC | PRN
  Administered 2020-08-08: 50 ug via INTRAVENOUS
  Filled 2020-08-01: qty 2

## 2020-08-01 MED ORDER — SODIUM CHLORIDE 0.9% FLUSH
3.0000 mL | Freq: Two times a day (BID) | INTRAVENOUS | Status: DC
  Administered 2020-08-01 – 2020-08-09 (×11): 3 mL via INTRAVENOUS

## 2020-08-01 MED ORDER — INSULIN GLARGINE 100 UNIT/ML ~~LOC~~ SOLN
10.0000 [IU] | Freq: Every day | SUBCUTANEOUS | Status: DC
  Administered 2020-08-01: 10 [IU] via SUBCUTANEOUS
  Filled 2020-08-01 (×3): qty 0.1

## 2020-08-01 MED ORDER — ENSURE ENLIVE PO LIQD
237.0000 mL | Freq: Two times a day (BID) | ORAL | Status: DC
  Administered 2020-08-06: 237 mL via ORAL

## 2020-08-01 MED ORDER — METOPROLOL TARTRATE 50 MG PO TABS
50.0000 mg | ORAL_TABLET | Freq: Two times a day (BID) | ORAL | Status: DC
  Administered 2020-08-06 – 2020-08-07 (×2): 50 mg via ORAL
  Filled 2020-08-01 (×6): qty 1

## 2020-08-01 MED ORDER — ATORVASTATIN CALCIUM 80 MG PO TABS
80.0000 mg | ORAL_TABLET | Freq: Every day | ORAL | Status: DC
  Administered 2020-08-01 – 2020-08-06 (×2): 80 mg via ORAL
  Filled 2020-08-01: qty 2
  Filled 2020-08-01 (×2): qty 1

## 2020-08-01 MED ORDER — INSULIN ASPART 100 UNIT/ML ~~LOC~~ SOLN: 0.0000 [IU] | Freq: Every day | SUBCUTANEOUS | Status: DC

## 2020-08-01 MED ORDER — DULOXETINE HCL 20 MG PO CPEP
40.0000 mg | ORAL_CAPSULE | Freq: Every day | ORAL | Status: DC
  Administered 2020-08-01 – 2020-08-09 (×3): 40 mg via ORAL
  Filled 2020-08-01 (×9): qty 2

## 2020-08-01 MED ORDER — ACETAMINOPHEN 650 MG RE SUPP: 650.0000 mg | Freq: Four times a day (QID) | RECTAL | Status: DC | PRN

## 2020-08-01 MED ORDER — NITROGLYCERIN 0.4 MG SL SUBL: 0.4000 mg | SUBLINGUAL_TABLET | SUBLINGUAL | Status: DC | PRN

## 2020-08-01 MED ORDER — INSULIN ASPART 100 UNIT/ML ~~LOC~~ SOLN
0.0000 [IU] | Freq: Three times a day (TID) | SUBCUTANEOUS | Status: DC
  Administered 2020-08-03 – 2020-08-04 (×2): 2 [IU] via SUBCUTANEOUS
  Administered 2020-08-04 – 2020-08-05 (×3): 1 [IU] via SUBCUTANEOUS
  Administered 2020-08-05: 2 [IU] via SUBCUTANEOUS
  Administered 2020-08-06: 1 [IU] via SUBCUTANEOUS
  Administered 2020-08-06: 3 [IU] via SUBCUTANEOUS
  Administered 2020-08-07: 1 [IU] via SUBCUTANEOUS

## 2020-08-01 MED ORDER — FUROSEMIDE 20 MG PO TABS
20.0000 mg | ORAL_TABLET | Freq: Every day | ORAL | Status: DC
  Administered 2020-08-01: 20 mg via ORAL
  Filled 2020-08-01: qty 1

## 2020-08-01 MED ORDER — SODIUM CHLORIDE 0.9 % IV SOLN: 250.0000 mL | INTRAVENOUS | Status: DC | PRN

## 2020-08-01 MED ORDER — ONDANSETRON HCL 4 MG/2ML IJ SOLN
4.0000 mg | Freq: Four times a day (QID) | INTRAMUSCULAR | Status: DC | PRN
  Administered 2020-08-02 – 2020-08-06 (×2): 4 mg via INTRAVENOUS
  Filled 2020-08-01 (×3): qty 2

## 2020-08-01 MED ORDER — POTASSIUM CHLORIDE CRYS ER 20 MEQ PO TBCR
40.0000 meq | EXTENDED_RELEASE_TABLET | ORAL | Status: AC
  Administered 2020-08-01 (×2): 40 meq via ORAL
  Filled 2020-08-01 (×2): qty 2

## 2020-08-01 MED ORDER — DILTIAZEM HCL-DEXTROSE 125-5 MG/125ML-% IV SOLN (PREMIX)
5.0000 mg/h | INTRAVENOUS | Status: DC
  Administered 2020-08-01: 5 mg/h via INTRAVENOUS
  Administered 2020-08-01 – 2020-08-03 (×4): 10 mg/h via INTRAVENOUS
  Administered 2020-08-04: 12.5 mg/h via INTRAVENOUS
  Administered 2020-08-04 – 2020-08-06 (×5): 15 mg/h via INTRAVENOUS
  Administered 2020-08-07: 5 mg/h via INTRAVENOUS
  Administered 2020-08-07: 12.5 mg/h via INTRAVENOUS
  Filled 2020-08-01 (×14): qty 125

## 2020-08-01 MED ORDER — MAGNESIUM OXIDE 400 (241.3 MG) MG PO TABS
400.0000 mg | ORAL_TABLET | Freq: Two times a day (BID) | ORAL | Status: DC
  Administered 2020-08-06 – 2020-08-08 (×3): 400 mg via ORAL
  Filled 2020-08-01 (×12): qty 1

## 2020-08-01 MED ORDER — VITAMIN D 25 MCG (1000 UNIT) PO TABS
2000.0000 [IU] | ORAL_TABLET | Freq: Every day | ORAL | Status: DC
  Administered 2020-08-01 – 2020-08-08 (×3): 2000 [IU] via ORAL
  Filled 2020-08-01 (×8): qty 2

## 2020-08-01 MED ORDER — ONDANSETRON HCL 4 MG PO TABS: 4.0000 mg | ORAL_TABLET | Freq: Four times a day (QID) | ORAL | Status: DC | PRN

## 2020-08-01 MED ORDER — LOSARTAN POTASSIUM 25 MG PO TABS
25.0000 mg | ORAL_TABLET | Freq: Every day | ORAL | Status: DC
  Administered 2020-08-06 – 2020-08-07 (×2): 25 mg via ORAL
  Filled 2020-08-01 (×4): qty 1

## 2020-08-01 MED ORDER — APIXABAN 5 MG PO TABS
5.0000 mg | ORAL_TABLET | Freq: Two times a day (BID) | ORAL | Status: DC
  Administered 2020-08-06 – 2020-08-08 (×3): 5 mg via ORAL
  Filled 2020-08-01 (×7): qty 1

## 2020-08-01 MED ORDER — MAGNESIUM SULFATE 4 GM/100ML IV SOLN
4.0000 g | Freq: Once | INTRAVENOUS | Status: AC
  Administered 2020-08-01: 4 g via INTRAVENOUS
  Filled 2020-08-01: qty 100

## 2020-08-01 MED ORDER — ACETAMINOPHEN 325 MG PO TABS
650.0000 mg | ORAL_TABLET | Freq: Four times a day (QID) | ORAL | Status: DC | PRN
  Administered 2020-08-06 – 2020-08-09 (×2): 650 mg via ORAL
  Filled 2020-08-01 (×2): qty 2

## 2020-08-01 MED ORDER — ALBUTEROL SULFATE (2.5 MG/3ML) 0.083% IN NEBU: 2.5000 mg | INHALATION_SOLUTION | RESPIRATORY_TRACT | Status: DC | PRN

## 2020-08-01 MED ORDER — DILTIAZEM LOAD VIA INFUSION
10.0000 mg | Freq: Once | INTRAVENOUS | Status: AC
  Administered 2020-08-01: 10 mg via INTRAVENOUS
  Filled 2020-08-01: qty 10

## 2020-08-01 NOTE — ED Notes (Signed)
Patient was given a dinner tray. 

## 2020-08-01 NOTE — Progress Notes (Addendum)
RN attempted to administer scheduled PO medications. Pt swatting at RN, shaking her head no. RN provided education to patient and sent a message to on call provider to notify of patient refusal of meds.

## 2020-08-01 NOTE — ED Provider Notes (Signed)
[facility] EMERGENCY DEPARTMENT Provider Note   CSN: [id number] Arrival date & time: 08/01/20  9518     History Chief Complaint  Patient presents with  . dislodged trach    Angel Allen is a 74 y.o. female.   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  Presents via EMS from her facility with this place tracheostomy for about 30 minutes.  EMS reports she was seen at another hospital last night for dislodged trach as well as was replaced.  Patient unable to give any history not able to speak.  Appears she has had the trach at least since April.  She does take Eliquis for history of atrial fibrillation.  She is having cough productive of clear and yellow mucus.  She denies chest pain.  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  The history is provided by the patient.       Past Medical History:  Diagnosis Date  . Acute on chronic respiratory failure with hypoxia ([place])   . Acute on chronic respiratory failure with hypoxia ([place])   . Atrial fibrillation ([place])   . 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 Cardiomyopathy, awaiting cath possible [facility] 12/21/2014  . Chronic atrial fibrillation (H[facility])    on coumadin  . Chronic atrial fibrillation ([place])   . Chronic kidney disease, stage III (moderate)   . CVA (cerebral infarction) 2009  . GERD (gastroesophageal reflux disease)   . Hyperlipidemia   . Hypertension   . Restless leg syndrome   . Systolic congestive heart failure with reduced left ventricular function, NYHA class 3 ([place])   . Type II diabetes mellitus [facility])     Patient Active Problem List   Diagnosis Date Noted  . Tracheostomy  dependence ([place])   . Chronic atrial fibrillation with rapid ventricular response ([place]) 07/06/2020  . GERD (gastroesophageal reflux disease)   . Hypertension   . Type II diabetes mellitus ([place])   . Acute on chronic respiratory failure with hypoxia ([place])   . 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 Systolic congestive heart failure with reduced left ventricular function, NYHA class 3 ([place])   . Chronic atrial fibrillation ([place])   . Chronic kidney disease, stage III (moderate)   . Cerebrovascular disease 2009  . Tracheostomy in place [facility]) 2009  . DNR (do not resuscitate) 2009  . DNI (do not intubate) 2009  . Leukocytosis 2009    Past Surgical History:  Procedure Laterality Date  . CARDIAC CATHETERIZATION  2012-2013   at OSH, no significant blockage, felt 2/2 stress  . LEFT AND RIGHT HEART CATHETERIZATION WITH CORONARY ANGIOGRAM N/A 12/21/2014   Procedure: LEFT AND RIGHT HEART CATHETERIZATION WITH CORONARY ANGIOGRAM;  Surgeon: Leonie Man, MD;  Location: [facility] CATH LAB;  Service: Cardiovascular;  Laterality: N/A;     OB History   No obstetric history on file.     Family History  Problem Relation Age of Onset  . Alzheimer's disease Mother   . CAD Neg Hx     Social History   Tobacco Use  . Smoking status: Former Research scientist (life sciences)  . Smokeless tobacco: Never Used  .  Tobacco comment: quit in 1980s  Substance Use Topics  . Alcohol use: No    Alcohol/week: 0.0 standard drinks  . Drug use: No    Home Medications Prior to Admission medications   Medication Sig Start Date End Date Taking? Authorizing Provider  atorvastatin (LIPITOR) 80 MG tablet Take 1 tablet (80 mg total) by mouth daily at 6 PM. 12/22/14   Eileen Stanford, PA-C  Cholecalciferol (VITAMIN D) 50 MCG (2000 UT) tablet Take 2,000 Units by mouth daily.    [provider]  diltiazem (CARDIZEM) 30 MG tablet Take 1 tablet (30 mg total) by mouth daily as needed (HR>130). 07/07/20   Johnson, Clanford L, MD  DULoxetine (CYMBALTA) 20  MG capsule Take 40 mg by mouth daily.    [provider]  furosemide (LASIX) 40 MG tablet Take 0.5 tablets (20 mg total) by mouth daily. 07/08/20   Johnson, Clanford L, MD  gabapentin (NEURONTIN) 100 MG capsule Take 200 mg by mouth at bedtime.     [provider]  insulin glargine (LANTUS) 100 UNIT/ML injection Inject 10 Units into the skin at bedtime.    [provider]  insulin lispro (HUMALOG) 100 UNIT/ML injection Inject 2-10 Units into the skin 3 (three) times daily before meals.    [provider]  losartan (COZAAR) 50 MG tablet Take 0.5 tablets (25 mg total) by mouth at bedtime. 12/24/14   Eileen Stanford, PA-C  magnesium oxide (MAG-OX) 400 MG tablet Take 400 mg by mouth 2 (two) times daily.    [provider]  Menthol-Zinc Oxide (CALMOSEPTINE) 0.44-20.6 % OINT Apply topically. Apply to buttocks topically every 8 hours prn for protection    [provider]  metoprolol (LOPRESSOR) 50 MG tablet Take 50 mg by mouth 2 (two) times daily.    [provider]  nitroGLYCERIN (NITROSTAT) 0.4 MG SL tablet Place 1 tablet (0.4 mg total) under the tongue every 5 (five) minutes as needed for chest pain. 12/22/14   Almyra Deforest, PA  Pollen Extracts (PROSTAT PO) Take 30 mLs by mouth in the morning.    [provider]  sennosides-docusate sodium (SENOKOT-S) 8.6-50 MG tablet Take 1 tablet by mouth at bedtime.    [provider]  warfarin (COUMADIN) 2 MG tablet Take 1 tablet (2 mg total) by mouth at bedtime. 07/07/20   Murlean Iba, MD    Allergies    Penicillins, Amoxicillin, and Morphine and related  Review of Systems   Review of Systems  Unable to perform ROS: Patient nonverbal    Physical Exam Updated Vital Signs BP 114/78 (BP Location: Left Arm)   Pulse 68   Temp 97.7 F (36.5 C) (Oral)   Resp 16   Ht 5\' 5"  (1.651 m)   Wt 41 kg   SpO2 98%   BMI 15.04 kg/m   Physical Exam Vitals and nursing note  reviewed.  Constitutional:      General: She is in acute distress.     Appearance: She is well-developed. She is ill-appearing.     Comments: Respiratory distress, frequent moist cough  HENT:     Head: Normocephalic and atraumatic.     Mouth/Throat:     Pharynx: No oropharyngeal exudate.     Comments: Tracheostomy displaced.  Stoma is patent.  Productive blood-streaked sputum Eyes:     Conjunctiva/sclera: Conjunctivae normal.     Pupils: Pupils are equal, round, and reactive to light.  Neck:     Comments: No meningismus.  Cardiovascular:     Rate and Rhythm: Normal rate. Rhythm irregular.     Heart sounds: Normal heart sounds. No murmur heard.      Comments: Regular tachycardia in the 100s Pulmonary:     Effort: Respiratory distress present.     Breath sounds: Wheezing and rhonchi present.     Comments: Increased work of breathing with bibasilar rhonchi and scattered wheezes Abdominal:     Palpations: Abdomen is soft.     Tenderness: There is no abdominal tenderness. There is no guarding or rebound.  Musculoskeletal:        General: No tenderness or signs of injury. Normal range of motion.     Cervical back: Normal range of motion and neck supple.  Skin:    General: Skin is warm.     Capillary Refill: Capillary refill takes less than 2 seconds.  Neurological:     Mental Status: She is alert.     Cranial Nerves: No cranial nerve deficit.     Motor: No abnormal muscle tone.     Coordination: Coordination normal.     Comments:  Unable to speak, moves all extremities and follows commands.  Psychiatric:        Behavior: Behavior normal.     ED Results / Procedures / Treatments   Labs (all labs ordered are listed, but only abnormal results are displayed) Labs Reviewed  CBC WITH DIFFERENTIAL/PLATELET - Abnormal; Notable for the following components:      Result Value   RBC 3.82 (*)    Hemoglobin 10.4 (*)    HCT 32.9 (*)    RDW 17.6 (*)    Monocytes Absolute 1.2 (*)     All other components within normal limits  BASIC METABOLIC PANEL - Abnormal; Notable for the following components:   Potassium 3.3 (*)    Creatinine, Ser 1.32 (*)    Calcium 8.6 (*)    GFR calc non Af Amer 39 (*)    GFR calc Af Amer 45 (*)    All other components within normal limits  PROTIME-INR - Abnormal; Notable for the following components:   Prothrombin Time 17.4 (*)    INR 1.5 (*)    All other components within normal limits  SARS CORONAVIRUS 2 BY RT PCR (HOSPITAL ORDER, [place] [facility])  MAGNESIUM  TROPONIN I (HIGH SENSITIVITY)  TROPONIN I (HIGH SENSITIVITY)    EKG EKG Interpretation  Date/Time:  Tuesday August 01 2020 06:29:00 EDT Ventricular Rate:  125 PR Interval:    QRS Duration: 101 QT Interval:  331 QTC Calculation: 478 R Axis:   -28 Text Interpretation: atrial fibrillation with rvr Low voltage, extremity leads Borderline repolarization abnormality Confirmed by Ezequiel Essex [phone]) on 08/01/2020 6:53:58 AM   Radiology DG Chest Portable 1 View  Result Date: 08/01/2020 CLINICAL DATA:  Dislodged tracheostomy. EXAM: PORTABLE CHEST 1 VIEW COMPARISON:  07/06/2020 FINDINGS: Cardiac pacemaker. Tracheostomy tube appears in place. Cardiac enlargement. Lungs are clear. No pleural effusions. No pneumothorax. IMPRESSION: Cardiac enlargement. No evidence of active pulmonary disease. Tracheostomy tube appears in satisfactory position. Electronically Signed   By: Lucienne Capers M.D.   On: 08/01/2020 06:41    Procedures TRACHEOSTOMY REPLACEMENT  Date/Time: 08/01/2020 6:33 AM Performed by: Ezequiel Essex, MD Authorized by: Ezequiel Essex, MD  Indications: became dislodged, fell out and malfunction Local anesthesia used: no  Anesthesia: Local anesthesia used: no  Sedation: Patient sedated: no  Preparation: Patient was prepped and draped in the usual sterile fashion.  Tube type: single cannula Tube cuff: cuffless Tube size: 4.0 mm Seldinger  technique: Seldinger technique used Patient tolerance: patient tolerated the procedure well with no immediate complications  .Critical Care Performed by: Ezequiel Essex, MD Authorized by: Ezequiel Essex, MD   Critical care provider statement:    Critical care time (minutes):  45   Critical care was necessary to treat or prevent imminent or life-threatening deterioration of the following conditions:  Respiratory failure (atrial fibrillation with RVR. displaced tracheostomy)   Critical care was time spent personally by me on the following activities:  Discussions with consultants, evaluation of patient's response to treatment, examination of patient, ordering and performing treatments and interventions, ordering and review of laboratory studies, ordering and review of radiographic studies, pulse oximetry, re-evaluation of patient's condition, obtaining history from patient or surrogate and review of old charts   (including critical care time)  Medications Ordered in ED Medications - No data to display  ED Course  I have reviewed the triage vital signs and the nursing notes.  Pertinent labs & imaging results that were available during my care of the patient were reviewed by me and considered in my medical decision making (see chart for details).    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  Seen at outside hospital last night for similar complaints.  She has mild respiratory distress is unable to speak.  Angel Allen was replaced with 4-0 cuffless trach without difficulty.  Her chest x-ray shows no pneumothorax. She was recently seen in trach clinic and thought not to be a candidate for decannulation.  Patient found to be in rapid atrial fibrillation with RVR.  She does have a history of this as well and takes Coumadin.  Also takes Cardizem and metoprolol.  Started on IV Cardizem  infusion on loading dose. Labs with stable hemoglobin. INR low at 1.5.  HR improving to 110-120s.  With ongoing Afib with RVR, admission d/w Dr. Fredric Dine. He will likely transfer to [facility] for E[facility] evaluation but this is not emergent.   Angel Allen was evaluated in Emergency Department on 08/01/2020 for the symptoms described in the history of present illness. She was evaluated in the context of the global COVID-19 pandemic, which necessitated consideration that the patient might be at risk for infection with the SARS-CoV-2 virus that causes COVID-19. Institutional protocols and algorithms that pertain to the evaluation of patients at risk for COVID-19 are in a state of rapid change based on information released by regulatory bodies including the CDC and federal and state organizations. These policies and algorithms were followed during the patient's care in the ED.   Final Clinical Impression(s) / ED Diagnoses Final diagnoses:  Tracheostomy malfunction ([place])  Atrial fibrillation with RVR [facility])    Rx / DC Orders ED Discharge Orders    None       Tiki Tucciarone, Annie Main, MD 08/01/20 [phone]

## 2020-08-01 NOTE — ED Notes (Signed)
XR IN ROOM

## 2020-08-01 NOTE — ED Notes (Signed)
Pt consistently removing heart monitor leads and O2 monitoring, pt keeps arm crossed which interferes with med admin, 2nd IV started and medication restarted at that location, will continue to monitor

## 2020-08-01 NOTE — H&P (Signed)
Patient Demographics:    Angel Allen, is a 74 y.o. female  MRN: [id number]   DOB - April 07, 1946  Admit Date - 08/01/2020  Outpatient Primary MD for the patient is Deanne Coffer, MD   Assessment & Plan:    Principal Problem:   Tracheal stricture Active Problems:   Chronic atrial fibrillation with rapid ventricular response ([place])   Tracheostomy in place [facility])   Tracheostomy dependence ([place])   Acute on chronic respiratory failure with hypoxia (HCC)   Cancer of glottis ([place])   Chronic kidney disease, stage III (moderate)   DNR (do not resuscitate)   DNI (do not intubate)   1)Tracheostomy in place/Trach Dependence----- Recurrent Dislodgment of patient's trach---- Transfer to [facility]--- Recurrent dislodgment of patient's trach---trach replaced 3 times over the last 24 hours, please get pulmonary consult and consider ENT consult  2)Chronic A. fib now with RVR--currently on IV Cardizem drip, continue p.o. metoprolol, continue Eliquis for anticoagulation for secondary stroke prophylaxis.  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 She was deemed not to be a chemotherapy candidate given her poor performance status. Palliative radiation was declined by the patient, and she was felt not to be an ideal Surgical candidate due to her comorbidities -Summary- 04/02/20 had ENT evaluation at [facility] 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 Staging: T3N0M0 invasivr8oe squamous cell carcinoma of the glottis, involving the anterior commissure and subglottis  4)DM2- Use Novolog/Humalog Sliding scale insulin with Accu-Cheks/Fingersticks as ordered   5)Hypokalemia and Hypomagnesemia-- magnesium is down to 1.4 potassium is down to 3.3 probably contributing to #2 above--- replace and recheck  6)Chronic normocytic and normochromic anemia--hemoglobin currently at recent baseline above 10, -Monitor closely no evidence of ongoing bleeding at this time  7)CKD III b--- stable, renal function appears to be at recent baseline -- renally adjust medications, avoid nephrotoxic agents / dehydration  / hypotension  Disposition/Need for in-Hospital Stay- patient unable to be discharged at this time due to --- recurrent dislodgment of patient's trach requiring further pulmonary consult and possibly ENT input, also A. fib with RVR requiring IV Cardizem drip for rate control  Dispo: The patient is from: SNF              Anticipated d/c is to: SNF              Anticipated d/c date is: 1 day              Patient currently is not medically stable to d/c.   With History of - Reviewed by me  Past Medical History:  Diagnosis Date  . Acute on chronic  respiratory failure with hypoxia ([place])   . Acute on chronic respiratory failure with hypoxia ([place])   . Atrial fibrillation ([place])   . Cancer of glottis ([place])   . Cardiomyopathy, awaiting cath possible [facility] 12/21/2014  . Chronic atrial fibrillation (HCC)    on coumadin  . Chronic atrial fibrillation ([place])   . Chronic kidney disease, stage III (moderate)   . CVA (cerebral infarction) 2009  . GERD (gastroesophageal reflux disease)   . Hyperlipidemia   . Hypertension   . Restless leg syndrome   . 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 Type II diabetes mellitus ([place])       Past Surgical History:  Procedure Laterality  Date  . CARDIAC CATHETERIZATION  2012-2013   at OSH, no significant blockage, felt 2/2 stress  . LEFT AND RIGHT HEART CATHETERIZATION WITH CORONARY ANGIOGRAM N/A 12/21/2014   Procedure: LEFT AND RIGHT HEART CATHETERIZATION WITH CORONARY ANGIOGRAM;  Surgeon: Leonie Man, MD;  Location: [facility] CATH LAB;  Service: Cardiovascular;  Laterality: N/A;    Chief Complaint  Patient presents with  . dislodged trach      HPI:    Angel Allen  is a 74 y.o. 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 She was deemed not to be a chemotherapy candidate given her poor performance status. Palliative radiation was declined by the patient, and she was felt not to be an ideal Surgical candidate due to her comorbidities   Summary- 04/02/20 had ENT evaluation at [facility] with Flexible laryngoscopy revealed a large right vocal cord mass  04/03/20--at [facility] she underwent an awake tracheostomy and direct laryngoscopy with biopsy.----invasive squamous cell carcinoma of both vocal cords. Staging: T3N0M0 invasivr8oe squamous cell carcinoma of the glottis, involving the anterior commissure and subglottis ---declined further radiation, chemotherapy, or surgery  -Pt was seen at [facility] on 07/31/2020 and a trach was replaced after it had been dislodged, overall continues to have episodes of trach dislodgment --  Recurrent Dislodgment of patient's trach---- Transfer to [facility]--- Recurrent dislodgment of patient's trach---trach replaced 3 times over the last 24 hours, please get pulmonary consult and consider ENT consult  -In ED chest x-ray without acute finding with tracheostomy tube in satisfactory position -Mag is 1.4 and potassium is 3.3 -INR is 1.5,  troponin is 13 -Creatinine 1.32 which is close to patient's baseline -WBC is 8.9, hemoglobin 10.4 which is close to patient's baseline, platelets are 260   Review of systems:    In addition to the HPI above,   A full Review of  Systems was done, all other systems reviewed are negative except as noted above in HPI , .    Social History:  Reviewed by me    Social History   Tobacco Use  . Smoking status: Former Research scientist (life sciences)  . Smokeless tobacco: Never Used  . Tobacco comment: quit in 1980s  Substance Use Topics  . Alcohol use: No    Alcohol/week: 0.0 standard drinks     Family History :  Reviewed by me    Family History  Problem Relation Age of Onset  . Alzheimer's disease Mother   . CAD Neg Hx  Home Medications:   Prior to Admission medications   Medication Sig Start Date End Date Taking? Authorizing Provider  apixaban (ELIQUIS) 5 MG TABS tablet Take 5 mg by mouth 2 (two) times daily.   Yes [provider]  atorvastatin (LIPITOR) 80 MG tablet Take 1 tablet (80 mg total) by mouth daily at 6 PM. 12/22/14  Yes Eileen Stanford, PA-C  Cholecalciferol (VITAMIN D) 50 MCG (2000 UT) tablet Take 2,000 Units by mouth daily.   Yes [provider]  DULoxetine (CYMBALTA) 20 MG capsule Take 40 mg by mouth daily.   Yes [provider]  furosemide (LASIX) 40 MG tablet Take 0.5 tablets (20 mg total) by mouth daily. 07/08/20  Yes Johnson, Clanford L, MD  gabapentin (NEURONTIN) 100 MG capsule Take 200 mg by mouth at bedtime.    Yes [provider]  insulin glargine (LANTUS) 100 UNIT/ML injection Inject 10 Units into the skin at bedtime.   Yes [provider]  insulin lispro (HUMALOG) 100 UNIT/ML injection Inject 2-10 Units into the skin 3 (three) times daily before meals.   Yes [provider]  losartan (COZAAR) 50 MG tablet Take 0.5 tablets (25 mg total) by mouth at bedtime. Patient taking differently: Take 50 mg by mouth at bedtime.   12/24/14  Yes Eileen Stanford, PA-C  magnesium oxide (MAG-OX) 400 MG tablet Take 400 mg by mouth 2 (two) times daily.   Yes [provider]  metoprolol (LOPRESSOR) 50 MG tablet Take 50 mg by mouth 2 (two) times daily.   Yes [provider]  Pollen Extracts (PROSTAT PO) Take 30 mLs by mouth in the morning.   Yes [provider]  sennosides-docusate sodium (SENOKOT-S) 8.6-50 MG tablet Take 1 tablet by mouth at bedtime.   Yes [provider]  diltiazem (CARDIZEM) 30 MG tablet Take 1 tablet (30 mg total) by mouth daily as needed (HR>130). 07/07/20   Johnson, Clanford L, MD  Menthol-Zinc Oxide (CALMOSEPTINE) 0.44-20.6 % OINT Apply topically. Apply to buttocks topically every 8 hours prn for protection    [provider]  nitroGLYCERIN (NITROSTAT) 0.4 MG SL tablet Place 1 tablet (0.4 mg total) under the tongue every 5 (five) minutes as needed for chest pain. 12/22/14   Almyra Deforest, PA  warfarin (COUMADIN) 2 MG tablet Take 1 tablet (2 mg total) by mouth at bedtime. Patient not taking: Reported on 08/01/2020 07/07/20   Murlean Iba, MD    Allergies:     Allergies  Allergen Reactions  . Penicillins Hives  . Amoxicillin Hives  . Morphine And Related     AMS, per son, she pulled out of her IV after morphine, however seems to be able to tolerate hydrocodone-acetaminophen    Physical Exam:   Vitals  Blood pressure 104/79, pulse 85, temperature 97.7 F (36.5 C), temperature source Oral, resp. rate 15, height 5\' 5"  (1.651 m), weight 41 kg, SpO2 100 %.  Physical Examination: General appearance - alert,   and in no distress  Mental status - alert, oriented to person, place, and time,  Eyes - sclera anicteric Neck -tracheostomy appears to be in satisfactory position  chest - clear  to auscultation bilaterally, symmetrical air movement,  Heart - S1 and S2 normal, irregularly irregular Abdomen - soft, nontender, nondistended, no masses or  organomegaly Neurological -generalized weakness, , no new focal deficits,  screening mental status exam normal, neck supple without rigidity, cranial nerves II through XII intact, DTR's normal and symmetric Extremities -  no pedal edema noted, intact peripheral pulses  Skin - warm, dry   Data Review:    CBC Recent Labs  Lab 08/01/20 0637  WBC 8.9  HGB 10.4*  HCT 32.9*  PLT 260  MCV 86.1  MCH 27.2  MCHC 31.6  RDW 17.6*  LYMPHSABS 2.2  MONOABS 1.2*  EOSABS 0.2  BASOSABS 0.0    Chemistries  Recent Labs  Lab 08/01/20 0637 08/01/20 0812  NA 141  --   K 3.3*  --   CL 107  --   CO2 25  --   GLUCOSE 98  --   BUN 17  --   CREATININE 1.32*  --   CALCIUM 8.6*  --   MG  --  1.4*   ------------------------------------------------------------------------------------------------------------------ estimated creatinine clearance is 23.5 mL/min (A) (by C-G formula based on SCr of 1.32 mg/dL (H)). ------------------------------------------------------------------------------------------------------------------ No results for input(s): TSH, T4TOTAL, T3FREE, THYROIDAB in the last 72 hours.  Invalid input(s): FREET3   Coagulation profile Recent Labs  Lab 08/01/20 0637  INR 1.5*   ------------------------------------------------------------------------------------------------------------------- No results for input(s): DDIMER in the last 72 hours.  Cardiac Enzymes No results for input(s): CKMB, TROPONINI, MYOGLOBIN in the last 168 hours.  Invalid input(s): CK ------------------------------------------------------------------------------------------------------------------ No results found for: BNP  Urinalysis No results found for: COLORURINE, APPEARANCEUR, LABSPEC, PHURINE, GLUCOSEU, HGBUR, BILIRUBINUR, KETONESUR, PROTEINUR, UROBILINOGEN, NITRITE,  LEUKOCYTESUR  ----------------------------------------------------------------------------------------------------------------   Imaging Results:    DG Chest Portable 1 View  Result Date: 08/01/2020 CLINICAL DATA:  Dislodged tracheostomy. EXAM: PORTABLE CHEST 1 VIEW COMPARISON:  07/06/2020 FINDINGS: Cardiac pacemaker. Tracheostomy tube appears in place. Cardiac enlargement. Lungs are clear. No pleural effusions. No pneumothorax. IMPRESSION: Cardiac enlargement. No evidence of active pulmonary disease. Tracheostomy tube appears in satisfactory position. Electronically Signed   By: Lucienne Capers M.D.   On: 08/01/2020 06:41    Radiological Exams on Admission: DG Chest Portable 1 View  Result Date: 08/01/2020 CLINICAL DATA:  Dislodged tracheostomy. EXAM: PORTABLE CHEST 1 VIEW COMPARISON:  07/06/2020 FINDINGS: Cardiac pacemaker. Tracheostomy tube appears in place. Cardiac enlargement. Lungs are clear. No pleural effusions. No pneumothorax. IMPRESSION: Cardiac enlargement. No evidence of active pulmonary disease. Tracheostomy tube appears in satisfactory position. Electronically Signed   By: Lucienne Capers M.D.   On: 08/01/2020 06:41   DVT Prophylaxis -SCD  /eliquis AM Labs Ordered, also please review Full Orders  Family Communication: Admission, patients condition and plan of care including tests being ordered have been discussed with the patient  who indicate understanding and agree with the plan   Code Status - Full Code  Likely DC to  SNF after further rate control and tracheostomy tube adjustments  Condition   stable  Roxan Hockey M.D on 08/01/2020 at 5:47 PM Go to [email or web address] -  for contact info  [facility] - Office  [phone]

## 2020-08-01 NOTE — ED Notes (Signed)
Lab in room.

## 2020-08-01 NOTE — ED Notes (Signed)
Upon entering room, trach noted to be out. Respiratory called and notified.

## 2020-08-01 NOTE — Progress Notes (Addendum)
Pt arrived to unit alert and in no apparent distress. VS obtained, patient placed on cardiac monitor and oriented to room.  Bed alarm activated. RN called RT to notify of patient arrival to floor.

## 2020-08-01 NOTE — ED Triage Notes (Signed)
Patient from [facility] and has a dislodged trach.

## 2020-08-01 NOTE — ED Notes (Signed)
RT called to assess trach displacement. Upon arrival it was noted that stoma was still patent and I was able to slide the trach back in place without complication. RR 17, HR 77 and SPO2 was 97%-100% on RA. Patient coughed out moderate amounts of thick tan. bbs noted.

## 2020-08-01 NOTE — Care Management Obs Status (Signed)
[place] NOTIFICATION   Patient Details  Name: Angel Allen MRN: [id number] Date of Birth: October 10, 1946   Medicare Observation Status Notification Given:  Yes (copy mailed to son Darnell Level at [address], [place], [place] [address])    Tommy Medal 08/01/2020, 3:03 PM

## 2020-08-02 ENCOUNTER — Observation Stay (HOSPITAL_COMMUNITY): Payer: Medicare Other

## 2020-08-02 DIAGNOSIS — Z66 Do not resuscitate: Secondary | ICD-10-CM | POA: Diagnosis present

## 2020-08-02 DIAGNOSIS — N183 Chronic kidney disease, stage 3 unspecified: Secondary | ICD-10-CM

## 2020-08-02 DIAGNOSIS — J9621 Acute and chronic respiratory failure with hypoxia: Secondary | ICD-10-CM

## 2020-08-02 DIAGNOSIS — Z515 Encounter for palliative care: Secondary | ICD-10-CM | POA: Diagnosis present

## 2020-08-02 DIAGNOSIS — E1122 Type 2 diabetes mellitus with diabetic chronic kidney disease: Secondary | ICD-10-CM | POA: Diagnosis present

## 2020-08-02 DIAGNOSIS — R410 Disorientation, unspecified: Secondary | ICD-10-CM

## 2020-08-02 DIAGNOSIS — D649 Anemia, unspecified: Secondary | ICD-10-CM | POA: Diagnosis present

## 2020-08-02 DIAGNOSIS — J398 Other specified diseases of upper respiratory tract: Secondary | ICD-10-CM

## 2020-08-02 DIAGNOSIS — E785 Hyperlipidemia, unspecified: Secondary | ICD-10-CM | POA: Diagnosis present

## 2020-08-02 DIAGNOSIS — Z7901 Long term (current) use of anticoagulants: Secondary | ICD-10-CM | POA: Diagnosis not present

## 2020-08-02 DIAGNOSIS — I69351 Hemiplegia and hemiparesis following cerebral infarction affecting right dominant side: Secondary | ICD-10-CM | POA: Diagnosis not present

## 2020-08-02 DIAGNOSIS — Z20822 Contact with and (suspected) exposure to covid-19: Secondary | ICD-10-CM | POA: Diagnosis present

## 2020-08-02 DIAGNOSIS — E11649 Type 2 diabetes mellitus with hypoglycemia without coma: Secondary | ICD-10-CM | POA: Diagnosis not present

## 2020-08-02 DIAGNOSIS — N1832 Chronic kidney disease, stage 3b: Secondary | ICD-10-CM | POA: Diagnosis present

## 2020-08-02 DIAGNOSIS — C320 Malignant neoplasm of glottis: Secondary | ICD-10-CM

## 2020-08-02 DIAGNOSIS — E876 Hypokalemia: Secondary | ICD-10-CM | POA: Diagnosis present

## 2020-08-02 DIAGNOSIS — I130 Hypertensive heart and chronic kidney disease with heart failure and stage 1 through stage 4 chronic kidney disease, or unspecified chronic kidney disease: Secondary | ICD-10-CM | POA: Diagnosis present

## 2020-08-02 DIAGNOSIS — J9503 Malfunction of tracheostomy stoma: Secondary | ICD-10-CM | POA: Diagnosis present

## 2020-08-02 DIAGNOSIS — J449 Chronic obstructive pulmonary disease, unspecified: Secondary | ICD-10-CM | POA: Diagnosis present

## 2020-08-02 DIAGNOSIS — I482 Chronic atrial fibrillation, unspecified: Secondary | ICD-10-CM | POA: Diagnosis present

## 2020-08-02 DIAGNOSIS — Z794 Long term (current) use of insulin: Secondary | ICD-10-CM | POA: Diagnosis not present

## 2020-08-02 DIAGNOSIS — Z820 Family history of epilepsy and other diseases of the nervous system: Secondary | ICD-10-CM | POA: Diagnosis not present

## 2020-08-02 DIAGNOSIS — I5022 Chronic systolic (congestive) heart failure: Secondary | ICD-10-CM | POA: Diagnosis present

## 2020-08-02 DIAGNOSIS — Z95810 Presence of automatic (implantable) cardiac defibrillator: Secondary | ICD-10-CM | POA: Diagnosis not present

## 2020-08-02 DIAGNOSIS — Z880 Allergy status to penicillin: Secondary | ICD-10-CM | POA: Diagnosis not present

## 2020-08-02 DIAGNOSIS — Z87891 Personal history of nicotine dependence: Secondary | ICD-10-CM | POA: Diagnosis not present

## 2020-08-02 DIAGNOSIS — Z7189 Other specified counseling: Secondary | ICD-10-CM | POA: Diagnosis not present

## 2020-08-02 DIAGNOSIS — Z930 Tracheostomy status: Secondary | ICD-10-CM | POA: Diagnosis not present

## 2020-08-02 LAB — URINALYSIS, ROUTINE W REFLEX MICROSCOPIC
Bilirubin Urine: NEGATIVE
Glucose, UA: NEGATIVE mg/dL
Ketones, ur: NEGATIVE mg/dL
Nitrite: POSITIVE — AB
Protein, ur: 30 mg/dL — AB
RBC / HPF: >50 RBC/hpf — ABNORMAL HIGH (ref 0–5)
Specific Gravity, Urine: 1.012 (ref 1.005–1.030)
WBC, UA: >50 WBC/hpf — ABNORMAL HIGH (ref 0–5)
pH: 5 (ref 5.0–8.0)

## 2020-08-02 LAB — VITAMIN B12: Vitamin B-12: 556 pg/mL (ref 180–914)

## 2020-08-02 LAB — GLUCOSE, CAPILLARY
Glucose-Capillary: 67 mg/dL — ABNORMAL LOW (ref 70–99)
Glucose-Capillary: 90 mg/dL (ref 70–99)
Glucose-Capillary: 83 mg/dL (ref 70–99)
Glucose-Capillary: 80 mg/dL (ref 70–99)
Glucose-Capillary: 69 mg/dL — ABNORMAL LOW (ref 70–99)

## 2020-08-02 LAB — FOLATE: Folate: 6.5 ng/mL (ref >5.9)

## 2020-08-02 MED ORDER — DEXTROSE 50 % IV SOLN
25.0000 mL | Freq: Once | INTRAVENOUS | Status: AC
  Administered 2020-08-02: 25 mL via INTRAVENOUS

## 2020-08-02 MED ORDER — DEXTROSE 50 % IV SOLN
INTRAVENOUS | Status: AC
  Filled 2020-08-02: qty 50

## 2020-08-02 MED ORDER — CIPROFLOXACIN IN D5W 400 MG/200ML IV SOLN
400.0000 mg | Freq: Two times a day (BID) | INTRAVENOUS | Status: DC
  Administered 2020-08-02 – 2020-08-08 (×12): 400 mg via INTRAVENOUS
  Filled 2020-08-02 (×13): qty 200

## 2020-08-02 NOTE — Progress Notes (Signed)
Upon shift change, pt had pulled out trach. Respiratory called and came to assess pt and replaced trach. Stat CXR order placed. [place] placed on pt. Will continue to monitor closely.

## 2020-08-02 NOTE — Progress Notes (Signed)
Attempted to feed patient and administer medication. Pt still refusing and will not open her mouth for any options given. Will continue to monitor and encourage PO intake. Respiratory came to re-assess trach.

## 2020-08-02 NOTE — Progress Notes (Signed)
Pt continues to refuse PO medication. RN and NT have encouraged PO intake. Nettey, MD is aware pt has not received morning PO medication at this time.

## 2020-08-02 NOTE — Progress Notes (Addendum)
Hypoglycemic Event  CBG: 67  Treatment: 12.5g Dextrose  Symptoms: None  Follow-up CBG: [id number] CBG Result:90  Possible Reasons for Event: Not eating    Angel Allen

## 2020-08-02 NOTE — Progress Notes (Addendum)
Pt refusing PO medication. Nettey,MD made aware. Will continue to encourage and monitor.

## 2020-08-02 NOTE — Consult Note (Signed)
NAME:  Angel Allen, MRN:  [id number], DOB:  05-Apr-1946, LOS: 0 ADMISSION DATE:  08/01/2020, CONSULTATION DATE:  08/02/2020 REFERRING MD:  Dr. Lonny Prude, CHIEF COMPLAINT:  Lurline Idol dependent    Brief History   74yo female presents from [facility] skilled nursing facility after dislodgment of chronic trach. PCCM consulted for trach management.   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 Other significant history includes prior CVA with right sided weakness, chronic A-fib, nonischemic cardiomyopathy, type 2 diabetes, depression, GERD, and stage IIIb CKD. Patient presented with dislodgement/self removal of trach at SNF, this was replaced in ED with 4.0 cuffless  Trach with no complications seen. Patient was also seen to be in A-fib RVR for which she was started on IV cardizem drip and admitted to hopsitalist team. PCCM was consulted for trach management.   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 rate 20, height 5\' 5"  (1.651 m), weight 90 kg, SpO2 100 %.    FiO2 (%):  [28 %] 28 %   Intake/Output Summary (Last 24 hours) at 08/02/2020 1225 Last data filed at 08/01/2020 1830 Gross per 24 hour  Intake 127.59 ml  Output --  Net 127.59 ml   Filed Weights   08/01/20 0622 08/01/20 2213 08/02/20 0407  Weight: 41 kg 90 kg 90 kg    Examination: General: Chronically ill appearing elderly female lying in bed in NAD HEENT: MC/AT, MM pink/moist, PERRL, sclera non-icteric   Neuro: Alert but disoriented x3, unable to follow any commands  CV: s1s2 regular rate and rhythm, no murmur, rubs, or gallops,  PULM: no increased work of breathing, no added breath sounds, currently tolerating ATC well  GI: soft, bowel sounds active in all 4 quadrants, non-tender, non-distended Extremities: warm/dry, no edema  Skin: no rashes or lesions   Resolved Hospital Problem list     Assessment & Plan:  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 Prior CVA -Patient is not a candidate for decannulation given untreated glottic cancer, poor phonation, and concern for trachel stenosis  -Follows Marni Griffon NP In the trach  clinic P: Routine trach care Plan for return to 4 cuffed shiley trach as this is her baseline size Mittens to discourage trach removal  Per Care everywhere patient has opted not to treat cancer Consider palliative consult   Continue ATC   PCCM will continue to follow once weekly for trach care    Best practice:  Diet: Reg  Pain/Anxiety/Delirium protocol (if indicated): PRNs VAP protocol (if indicated): N/A DVT prophylaxis: Eliquis GI prophylaxis: PPI Glucose control: SSI Mobility: Up  with assistance  Code Status: DNR/DNI Family Communication: Per primary  Disposition:   Labs   CBC: Recent Labs  Lab 08/01/20 0637  WBC 8.9  NEUTROABS 5.1  HGB 10.4*  HCT 32.9*  MCV 86.1  PLT 937    Basic Metabolic Panel: Recent Labs  Lab 08/01/20 0637 08/01/20 0812  NA 141  --   K 3.3*  --   CL 107  --   CO2 25  --   GLUCOSE 98  --   BUN 17  --   CREATININE 1.32*  --   CALCIUM 8.6*  --   MG  --  1.4*   GFR: Estimated Creatinine Clearance: 40.2 mL/min (A) (by C-G formula based on SCr of 1.32 mg/dL (H)). Recent Labs  Lab 08/01/20 0637  WBC 8.9    Liver Function Tests: No results for input(s): AST, ALT, ALKPHOS, BILITOT, PROT, ALBUMIN in the last 168 hours. No results for input(s): LIPASE, AMYLASE in the last 168 hours. No results for input(s): AMMONIA in the last 168 hours.  ABG    Component Value Date/Time   PHART 7.369 12/21/2014 1214   PCO2ART 37.1 12/21/2014 1214   PO2ART 60.0 (L) 12/21/2014 1214   HCO3 21.7 12/21/2014 1218   TCO2 23 12/21/2014 1218   ACIDBASEDEF 3.0 (H) 12/21/2014 1218   O2SAT 40.0 12/21/2014 1218     Coagulation Profile: Recent Labs  Lab 08/01/20 0637  INR 1.5*    Cardiac Enzymes: No results for input(s): CKTOTAL, CKMB, CKMBINDEX, TROPONINI in the last 168 hours.  HbA1C: Hgb A1c MFr Bld  Date/Time Value Ref Range Status  07/06/2020 09:24 AM 5.9 (H) 4.8 - 5.6 % Final    Comment:    (NOTE) Pre diabetes:          5.7%-6.4%  Diabetes:              >6.4%  Glycemic control for   <7.0% adults with diabetes   12/19/2014 04:10 PM 7.8 (H) <5.7 % Final    Comment:    (NOTE)                                                                       According to the ADA Clinical Practice Recommendations for 2011, when HbA1c is used as a screening test:  >=6.5%   Diagnostic of Diabetes Mellitus           (if abnormal result is confirmed) 5.7-6.4%   Increased risk of developing Diabetes Mellitus References:Diagnosis and  Classification of Diabetes Mellitus,Diabetes JIRC,7893,81(OFBPZ 1):S62-S69 and Standards of Medical Care in         Diabetes - 2011,Diabetes WCHE,5277,82 (Suppl 1):S11-S61. RESULT FROM [id number]     CBG: Recent Labs  Lab 08/01/20 2229 08/02/20  6578 08/02/20 0824 08/02/20 1200  GLUCAP 113* 67* 90 83    Review of Systems: Positive in bold   Gen: Denies fever, chills, weight change, fatigue, night sweats HEENT: Denies blurred vision, double vision, hearing loss, tinnitus, sinus congestion, rhinorrhea, sore throat, neck stiffness, dysphagia PULM: Denies shortness of breath, cough, sputum production, hemoptysis, wheezing CV: Denies chest pain, edema, orthopnea, paroxysmal nocturnal dyspnea, palpitations GI: Denies abdominal pain, nausea, vomiting, diarrhea, hematochezia, melena, constipation, change in bowel habits GU: Denies dysuria, hematuria, polyuria, oliguria, urethral discharge Endocrine: Denies hot or cold intolerance, polyuria, polyphagia or appetite change Derm: Denies rash, dry skin, scaling or peeling skin change Heme: Denies easy bruising, bleeding, bleeding gums Neuro: Denies headache, numbness, weakness, slurred speech, loss of memory or consciousness  Past Medical History  She,  has a past medical history of Acute on chronic respiratory failure with hypoxia (HCC), Acute on chronic respiratory failure with hypoxia ([place]), Atrial fibrillation ([place]), Cancer of glottis ([place]), Cardiomyopathy, awaiting cath possible Takatsubo (12/21/2014), Chronic atrial fibrillation (HCC), Chronic atrial fibrillation (HCC), Chronic kidney disease, stage III (moderate), CVA (cerebral infarction) (2009), GERD (gastroesophageal reflux disease), Hyperlipidemia, Hypertension, Restless leg syndrome, 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   Surgical History    Past Surgical History:  Procedure Laterality Date     CARDIAC CATHETERIZATION  2012-2013   at OSH, no significant blockage, felt 2/2 stress   LEFT AND RIGHT HEART CATHETERIZATION WITH CORONARY ANGIOGRAM N/A 12/21/2014   Procedure: LEFT AND RIGHT HEART CATHETERIZATION WITH CORONARY ANGIOGRAM;  Surgeon: Leonie Man, MD;  Location: [facility] CATH LAB;  Service: Cardiovascular;  Laterality: N/A;     Social History   reports that she has quit smoking. She has never used smokeless tobacco. She reports that she does not drink alcohol and does not use drugs.   Family History   Her family history includes Alzheimer's disease in her mother. There is no history of CAD.   Allergies Allergies  Allergen Reactions   Penicillins Hives   Amoxicillin Hives   Morphine And Related     AMS, per son, she pulled out of her IV after morphine, however seems to be able to tolerate hydrocodone-acetaminophen     Home Medications  Prior to Admission medications   Medication Sig Start Date End Date Taking? Authorizing Provider  apixaban (ELIQUIS) 5 MG TABS tablet Take 5 mg by mouth 2 (two) times daily.   Yes [provider]  atorvastatin (LIPITOR) 80 MG tablet Take 1 tablet (80 mg total) by mouth daily at 6 PM. 12/22/14  Yes Eileen Stanford, PA-C  Cholecalciferol (VITAMIN D) 50 MCG (2000 UT) tablet Take 2,000 Units by mouth daily.   Yes [provider]  DULoxetine (CYMBALTA) 20 MG capsule Take 40 mg by mouth daily.   Yes [provider]  furosemide (LASIX) 40 MG tablet Take 0.5 tablets (20 mg total) by mouth daily. 07/08/20  Yes Johnson, Clanford L, MD  gabapentin (NEURONTIN) 100 MG capsule Take 200 mg by mouth at bedtime.    Yes [provider]  insulin glargine (LANTUS) 100 UNIT/ML injection Inject 10 Units into the skin at bedtime.   Yes [provider]  insulin lispro (HUMALOG) 100 UNIT/ML injection Inject 2-10 Units into the skin 3 (three) times daily before meals.   Yes [provider]  losartan  (COZAAR) 50 MG tablet Take 0.5 tablets (25 mg total) by mouth at bedtime.  Patient taking differently: Take 50 mg by mouth at bedtime.  12/24/14  Yes Eileen Stanford, PA-C  magnesium oxide (MAG-OX) 400 MG tablet Take 400 mg by mouth 2 (two) times daily.   Yes [provider]  metoprolol (LOPRESSOR) 50 MG tablet Take 50 mg by mouth 2 (two) times daily.   Yes [provider]  Pollen Extracts (PROSTAT PO) Take 30 mLs by mouth in the morning.   Yes [provider]  sennosides-docusate sodium (SENOKOT-S) 8.6-50 MG tablet Take 1 tablet by mouth at bedtime.   Yes [provider]  diltiazem (CARDIZEM) 30 MG tablet Take 1 tablet (30 mg total) by mouth daily as needed (HR>130). 07/07/20   Johnson, Clanford L, MD  Menthol-Zinc Oxide (CALMOSEPTINE) 0.44-20.6 % OINT Apply topically. Apply to buttocks topically every 8 hours prn for protection    [provider]  nitroGLYCERIN (NITROSTAT) 0.4 MG SL tablet Place 1 tablet (0.4 mg total) under the tongue every 5 (five) minutes as needed for chest pain. 12/22/14   Almyra Deforest, PA     Signature:  Johnsie Cancel, NP-C [facility] Contact / Pager information can be found on Amion  08/02/2020, 12:51 PM

## 2020-08-02 NOTE — Progress Notes (Addendum)
PROGRESS NOTE    Angel Allen  FYB:[id number] DOB: 01/23/1946 DOA: 08/01/2020 PCP: Deanne Coffer, MD   Brief Narrative: Angel Allen is a 74 y.o. 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  Patient presented secondary to recurrent dislodgment of her tracheostomy tube.   Assessment & Plan:   Principal Problem:   Tracheal stricture Active Problems:   Acute on chronic respiratory failure with hypoxia (HCC)   Cancer of glottis (HCC)   Chronic kidney disease, stage III (moderate)   Chronic atrial fibrillation with rapid ventricular response ([place])   Tracheostomy in place [facility])   DNR (do not resuscitate)   DNI (do not intubate)   Tracheostomy dependence ([place])   Tracheostomy in place Tracheostomy dependence Patient with recurrent dislodgment of her trach.  Patient states that she is not pulling this out but concerned there could be some confusion.  There is no witness with regard to patient pulling out her trach.  Patient transferred from any plan to [facility] for pulmonology evaluation. -Pulmonology consult  Confusion I discussion with patient's son.  Patient's son states that she has had some intermittent issues with confusion.  He states that this usually happens when she has urinary tract infections.  Vitamin B12 and folate within normal limits.  TSH within normal limits. -Urinalysis and urine culture; if concern for infection will start ciprofloxacin IV secondary to penicillin allergy -CT head  Chronic atrial fibrillation with RVR Patient was started on Cardizem drip on admission.  Patient is on Cardizem 30 mg daily as needed in addition to metoprolol 50 mg twice daily.  Rate is improved on Cardizem drip however patient is not taking p.o. reliably -Continue Cardizem drip  until confusion improved and patient is taking p.o. more consistently -Continue Eliquis as able  Essential hypertension Patient is on losartan and metoprolol as an outpatient.  Blood pressure is controlled.  Patient did not take her losartan or metoprolol -Continue home losartan and metoprolol  Diabetes mellitus, type II Patient is on Lantus 10 units nightly in addition to Humalog sliding scale 2 to 10 units 3 times daily before meals.  Last hemoglobin A1c of 5.9% in July 2021.  Hypoglycemic this morning the capillary blood sugar of 67. -Discontinue Lantus for now -Continue sliding scale insulin  Invasive squamous cell carcinoma of the glottis Patient currently not receiving chemotherapy or radiation.  Not a surgical candidate per chart review. -Palliative care consult  CKD stage IIIb Stable.   DVT prophylaxis: Eliquis Code Status:   Code Status: DNR Family Communication: Son on telephone Disposition Plan: Anticipate discharge back to SNF likely in 2 to 3 days pending improvement of mental status   Consultants:   Pulmonology  Procedures:   None  Antimicrobials:  None   Subjective: Patient reports no issues overnight.  She states that she did not take her trach out and that it fell out.  Objective: Vitals:   08/02/20 0720 08/02/20 0752 08/02/20 1144 08/02/20 1201  BP:  122/71 107/71   Pulse:  99 95   Resp: (!) 24 20    Temp:  98.2 F (36.8 C)  (!) 97.5 F (36.4 C)  TempSrc:  Oral  Axillary  SpO2:  100% 100%   Weight:      Height:        Intake/Output Summary (Last 24  hours) at 08/02/2020 1519 Last data filed at 08/01/2020 1830 Gross per 24 hour  Intake 127.59 ml  Output --  Net 127.59 ml   Filed Weights   08/01/20 0622 08/01/20 2213 08/02/20 0407  Weight: 41 kg 90 kg 90 kg    Examination:  General exam: Appears calm and comfortable Respiratory system: Clear to auscultation. Respiratory effort normal. Cardiovascular system: S1 & S2 heard, RRR. No  murmurs, rubs, gallops or clicks. Gastrointestinal system: Abdomen is nondistended, soft and nontender. No organomegaly or masses felt. Normal bowel sounds heard. Central nervous system: Somnolent but arouses easily and answers questions seemingly appropriately mostly. Musculoskeletal: No edema. No calf tenderness Skin: No cyanosis. No rashes     Data Reviewed: I have personally reviewed following labs and imaging studies  CBC Lab Results  Component Value Date   WBC 8.9 08/01/2020   RBC 3.82 (L) 08/01/2020   HGB 10.4 (L) 08/01/2020   HCT 32.9 (L) 08/01/2020   MCV 86.1 08/01/2020   MCH 27.2 08/01/2020   PLT 260 08/01/2020   MCHC 31.6 08/01/2020   RDW 17.6 (H) 08/01/2020   LYMPHSABS 2.2 08/01/2020   MONOABS 1.2 (H) 08/01/2020   EOSABS 0.2 08/01/2020   BASOSABS 0.0 35/36/1443     Last metabolic panel Lab Results  Component Value Date   NA 141 08/01/2020   K 3.3 (L) 08/01/2020   CL 107 08/01/2020   CO2 25 08/01/2020   BUN 17 08/01/2020   CREATININE 1.32 (H) 08/01/2020   GLUCOSE 98 08/01/2020   GFRNONAA 39 (L) 08/01/2020   GFRAA 45 (L) 08/01/2020   CALCIUM 8.6 (L) 08/01/2020   PROT 5.4 (L) 07/07/2020   ALBUMIN 2.2 (L) 07/07/2020   BILITOT 0.7 07/07/2020   ALKPHOS 73 07/07/2020   AST 17 07/07/2020   ALT 12 07/07/2020   ANIONGAP 9 08/01/2020    CBG (last 3)  Recent Labs    08/02/20 0748 08/02/20 0824 08/02/20 1200  GLUCAP 67* 90 83     GFR: Estimated Creatinine Clearance: 40.2 mL/min (A) (by C-G formula based on SCr of 1.32 mg/dL (H)).  Coagulation Profile: Recent Labs  Lab 08/01/20 0637  INR 1.5*    Recent Results (from the past 240 hour(s))  SARS Coronavirus 2 by RT PCR (hospital order, performed in [facility] hospital lab) Nasopharyngeal Nasopharyngeal Swab     Status: None   Collection Time: 08/01/20  6:21 AM   Specimen: Nasopharyngeal Swab  Result Value Ref Range Status   SARS Coronavirus 2 NEGATIVE NEGATIVE Final    Comment:  (NOTE) SARS-CoV-2 target nucleic acids are NOT DETECTED.  The SARS-CoV-2 RNA is generally detectable in upper and lower respiratory specimens during the acute phase of infection. The lowest concentration of SARS-CoV-2 viral copies this assay can detect is 250 copies / mL. A negative result does not preclude SARS-CoV-2 infection and should not be used as the sole basis for treatment or other patient management decisions.  A negative result may occur with improper specimen collection / handling, submission of specimen other than nasopharyngeal swab, presence of viral mutation(s) within the areas targeted by this assay, and inadequate number of viral copies (<250 copies / mL). A negative result must be combined with clinical observations, patient history, and epidemiological information.  Fact Sheet for Patients:   StrictlyIdeas.no  Fact Sheet for Healthcare Providers: BankingDealers.co.za  This test is not yet approved or  cleared by the [place] FDA and has been authorized for detection and/or diagnosis of SARS-CoV-2  by FDA under an Emergency Use Authorization (EUA).  This EUA will remain in effect (meaning this test can be used) for the duration of the COVID-19 declaration under Section 564(b)(1) of the Act, 21 U.S.C. section 360bbb-3(b)(1), unless the authorization is terminated or revoked sooner.  Performed at [facility], [address], [place], [place] [address]         Radiology Studies: CT HEAD WO CONTRAST  Result Date: 08/02/2020 CLINICAL DATA:  Mental status change EXAM: CT HEAD WITHOUT CONTRAST TECHNIQUE: Contiguous axial images were obtained from the base of the skull through the vertex without intravenous contrast. COMPARISON:  None. FINDINGS: Brain: Moderate atrophy. Extensive chronic microvascular ischemic change throughout the white matter and basal ganglia Negative for acute infarct, hemorrhage, mass. Vascular:  Negative for hyperdense vessel Skull: Negative Sinuses/Orbits: Mild mucosal edema maxillary sinus bilaterally. Negative orbit. Other: None IMPRESSION: No acute abnormality. Moderate atrophy and extensive chronic microvascular ischemic change. Electronically Signed   By: Franchot Gallo M.D.   On: 08/02/2020 11:09   DG CHEST PORT 1 VIEW  Result Date: 08/02/2020 CLINICAL DATA:  74 year old female with tracheostomy malfunction. Tracheostomy replaced. EXAM: PORTABLE CHEST 1 VIEW COMPARISON:  Portable chest 08/01/2020 and earlier. FINDINGS: Portable AP semi upright view at 0733 hours. Unchanged tracheostomy tube position, no adverse features. Stable lung volumes and mediastinal contours. Stable left chest AICD. Visualized tracheal air column is within normal limits. Allowing for portable technique the lungs are clear. Mildly increased bowel gas in the visible upper abdomen. No acute osseous abnormality identified. IMPRESSION: 1. Tracheostomy tube in place with no adverse features. 2.  No acute cardiopulmonary abnormality. Electronically Signed   By: Genevie Ann M.D.   On: 08/02/2020 07:58   DG Chest Portable 1 View  Result Date: 08/01/2020 CLINICAL DATA:  Dislodged tracheostomy. EXAM: PORTABLE CHEST 1 VIEW COMPARISON:  07/06/2020 FINDINGS: Cardiac pacemaker. Tracheostomy tube appears in place. Cardiac enlargement. Lungs are clear. No pleural effusions. No pneumothorax. IMPRESSION: Cardiac enlargement. No evidence of active pulmonary disease. Tracheostomy tube appears in satisfactory position. Electronically Signed   By: Lucienne Capers M.D.   On: 08/01/2020 06:41        Scheduled Meds: . apixaban  5 mg Oral BID  . atorvastatin  80 mg Oral q1800  . cholecalciferol  2,000 Units Oral Daily  . DULoxetine  40 mg Oral Daily  . feeding supplement (ENSURE ENLIVE)  237 mL Oral BID BM  . furosemide  20 mg Oral Daily  . gabapentin  200 mg Oral QHS  . insulin aspart  0-5 Units Subcutaneous QHS  . insulin aspart   0-9 Units Subcutaneous TID WC  . losartan  25 mg Oral QHS  . magnesium oxide  400 mg Oral BID  . metoprolol tartrate  50 mg Oral BID  . senna-docusate  1 tablet Oral QHS  . sodium chloride flush  3 mL Intravenous Q12H   Continuous Infusions: . sodium chloride    . ciprofloxacin    . diltiazem (CARDIZEM) infusion 10 mg/hr (08/02/20 1042)     LOS: 0 days     Cordelia Poche, MD [facility] 08/02/2020, 3:19 PM  If 7PM-7AM, please contact night-coverage [email or web address]

## 2020-08-02 NOTE — Procedures (Signed)
Tracheostomy Change Note  Patient Details:   Name: Angel Allen DOB: [birth year] MRN: [id number]    Airway Documentation:     Evaluation  O2 sats: stable throughout Complications: Complications of pt pulled out prior trach. Stoma had began to close up. Patient did tolerate procedure well. Bilateral Breath Sounds: Rhonchi    After pt pulled out 6.5 cuffless birvona trach, 6.0 cuffless shiley was replaced in airway. Stoma had began to close. Vitals stayed WNL, STAT CXR ordered, positive CO2 color change, BBS noted. RT will continue to monitor.  Esperanza Sheets T 08/02/2020, 7:30 AM

## 2020-08-02 NOTE — Progress Notes (Signed)
Inpatient Diabetes Program Recommendations  AACE/ADA: New Consensus Statement on Inpatient Glycemic Control (2015)  Target Ranges:  Prepandial:   less than 140 mg/dL      Peak postprandial:   less than 180 mg/dL (1-2 hours)      Critically ill patients:  140 - 180 mg/dL   Lab Results  Component Value Date   GLUCAP 83 08/02/2020   HGBA1C 5.9 (H) 07/06/2020    Review of Glycemic Control Results for STORMY, CONNON (MRN [id number]) as of 08/02/2020 15:07  Ref. Range 08/01/2020 22:29 08/02/2020 07:48 08/02/2020 08:24 08/02/2020 12:00  Glucose-Capillary Latest Ref Range: 70 - 99 mg/dL 113 (H) 67 (L) 90 83   Diabetes history: Type 2 Dm Outpatient Diabetes medications: Lantus 10 units QHS, Humalog 2-10 units TID  Current orders for Inpatient glycemic control: Novolog 0-9 units TID, Novolog 0-5 units QHS  Inpatient Diabetes Program Recommendations:    Noted mild low this AM of 67 mg/dL. Consider slight decrease to Lantus to 6 units QHS vs discontinuing Lantus given current trends.  Thanks, Bronson Curb, MSN, RNC-OB Diabetes Coordinator [phone] (8a-5p)

## 2020-08-03 DIAGNOSIS — J9503 Malfunction of tracheostomy stoma: Principal | ICD-10-CM

## 2020-08-03 DIAGNOSIS — Z515 Encounter for palliative care: Secondary | ICD-10-CM

## 2020-08-03 DIAGNOSIS — Z7189 Other specified counseling: Secondary | ICD-10-CM

## 2020-08-03 DIAGNOSIS — Z66 Do not resuscitate: Secondary | ICD-10-CM

## 2020-08-03 LAB — BASIC METABOLIC PANEL
Anion gap: 9 (ref 5–15)
BUN: 13 mg/dL (ref 8–23)
CO2: 25 mmol/L (ref 22–32)
Calcium: 8.9 mg/dL (ref 8.9–10.3)
Chloride: 107 mmol/L (ref 98–111)
Creatinine, Ser: 1.52 mg/dL — ABNORMAL HIGH (ref 0.44–1.00)
GFR calc Af Amer: 38 mL/min — ABNORMAL LOW (ref >60)
GFR calc non Af Amer: 33 mL/min — ABNORMAL LOW (ref >60)
Glucose, Bld: 69 mg/dL — ABNORMAL LOW (ref 70–99)
Potassium: 4.1 mmol/L (ref 3.5–5.1)
Sodium: 141 mmol/L (ref 135–145)

## 2020-08-03 LAB — URINE CULTURE

## 2020-08-03 LAB — GLUCOSE, CAPILLARY
Glucose-Capillary: 105 mg/dL — ABNORMAL HIGH (ref 70–99)
Glucose-Capillary: 108 mg/dL — ABNORMAL HIGH (ref 70–99)
Glucose-Capillary: 156 mg/dL — ABNORMAL HIGH (ref 70–99)
Glucose-Capillary: 68 mg/dL — ABNORMAL LOW (ref 70–99)
Glucose-Capillary: 88 mg/dL (ref 70–99)
Glucose-Capillary: 97 mg/dL (ref 70–99)

## 2020-08-03 LAB — MAGNESIUM: Magnesium: 1.7 mg/dL (ref 1.7–2.4)

## 2020-08-03 MED ORDER — DEXTROSE 50 % IV SOLN
INTRAVENOUS | Status: AC
  Filled 2020-08-03: qty 50

## 2020-08-03 MED ORDER — QUETIAPINE FUMARATE 25 MG PO TABS
25.0000 mg | ORAL_TABLET | Freq: Every day | ORAL | Status: DC
  Administered 2020-08-06 – 2020-08-08 (×3): 25 mg via ORAL
  Filled 2020-08-03 (×3): qty 1

## 2020-08-03 MED ORDER — DEXTROSE 50 % IV SOLN
1.0000 | Freq: Once | INTRAVENOUS | Status: AC
  Administered 2020-08-03: 50 mL via INTRAVENOUS

## 2020-08-03 MED ORDER — DEXTROSE-NACL 5-0.45 % IV SOLN: INTRAVENOUS | Status: DC

## 2020-08-03 NOTE — Consult Note (Signed)
Consultation Note Date: 08/03/2020   Patient Name: Angel Allen  DOB: Aug 06, 1946  MRN: [id number]  Age / Sex: 74 y.o., female  PCP: Deanne Coffer, MD Referring Physician: Mariel Aloe, MD  Reason for Consultation: Establishing goals of care  HPI/Patient Profile: 74 y.o. 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 PMT consulted for [place].  Clinical Assessment and Goals of Care: I have reviewed medical records including EPIC notes, labs and imaging, assessed the patient and then spoke with patient's son Darnell Level  to discuss diagnosis prognosis, GOC, EOL wishes, disposition and options.  I introduced Palliative Medicine as specialized medical care for people living with serious illness. It focuses on providing relief from the symptoms and stress of a serious illness. The goal is to improve quality of life for both the patient and the family.  Bruce tells me about patient's diagnosis in April and decision not to pursue treatment - not a surgical candidate, chemo not a good option d/t heart and renal failure, and did not think radiation would add to quality of life. Tells me patient has been living at [facility] since May. She is non-ambulatory. He tells me about cognitive decline - becoming easily confused recently.   We discussed patient's current illness and what it means in the larger context of patient's on-going co-morbidities.  Natural disease trajectory and expectations at EOL were discussed. Bruce understands situation. He speaks of untreated cancer and concerns about progression. He speaks of patient continuing to remove trach - he believes this is unintentional and r/t confusion. He is hopeful some confusion is r/t UTI? Hopeful for some improvement but understands  there is likely some underlying dementia. Understands high risk of continued removal of trach and danger of this.  I attempted to elicit values and goals of care important to the patient.  Son shares that patient has been clear about goals of care and not interested in aggressive measures - requested DNR status, would never want feeding tube, did not want to pursue radiation.   Discussed with son the importance of continued conversation with family and the medical providers regarding overall plan of care and treatment options, ensuring decisions are within the context of the patient's values and GOCs.    Hospice services outpatient were explained and offered. Son is interested in extra support of hospice upon return to [facility].  Questions and concerns were addressed. The family was encouraged to call with questions or concerns.   Primary Decision Maker NEXT OF KIN son Darnell Level    SUMMARY OF RECOMMENDATIONS   - son understands situation and high risk of continued removal of trach - interested in hospice support upon return to [facility] to trial seroquel - patient has expressed desire to avoid aggressive interventions such as resuscitation efforts, ventilator, radiation, and feeding tube  Code Status/Advance Care Planning:  DNR  Additional Recommendations (Limitations, Scope, Preferences):  No Artificial Feeding  Discharge Planning: [place] with Hospice      Primary Diagnoses: Present on Admission: . Tracheal stricture . Acute on chronic respiratory failure with hypoxia ([place]) . Cancer of glottis ([place]) . Chronic atrial fibrillation with rapid ventricular response ([place]) . Chronic kidney disease, stage III (moderate) . DNI (do not intubate) . DNR (do not resuscitate)   I have reviewed the medical record, interviewed the  patient and family, and examined the patient. The following aspects are pertinent.  Past Medical History:  Diagnosis Date    . Acute on chronic respiratory failure with hypoxia ([place])   . Acute on chronic respiratory failure with hypoxia ([place])   . Atrial fibrillation ([place])   . Cancer of glottis ([place])   . Cardiomyopathy, awaiting cath possible [facility] 12/21/2014  . Chronic atrial fibrillation (HCC)    on coumadin  . Chronic atrial fibrillation ([place])   . Chronic kidney disease, stage III (moderate)   . CVA (cerebral infarction) 2009  . GERD (gastroesophageal reflux disease)   . Hyperlipidemia   . Hypertension   . Restless leg syndrome   . Systolic congestive heart failure with reduced left ventricular function, NYHA class 3 ([place])   . Type II diabetes mellitus ([place])    Social History   Socioeconomic History  . Marital status: Unknown    Spouse name: Not on file  . Number of children: Not on file  . Years of education: Not on file  . Highest education level: Not on file  Occupational History  . Not on file  Tobacco Use  . Smoking status: Former Research scientist (life sciences)  . Smokeless tobacco: Never Used  . Tobacco comment: quit in 1980s  Substance and Sexual Activity  . Alcohol use: No    Alcohol/week: 0.0 standard drinks  . Drug use: No  . Sexual activity: Not on file  Other Topics Concern  . Not on file  Social History Narrative  . Not on file   Social Determinants of Health   Financial Resource Strain:   . Difficulty of Paying Living Expenses:   Food Insecurity:   . Worried About Charity fundraiser in the Last Year:   . Arboriculturist in the Last Year:   Transportation Needs:   . Film/video editor (Medical):   Marland Kitchen Lack of Transportation (Non-Medical):   Physical Activity:   . Days of Exercise per Week:   . Minutes of Exercise per Session:   Stress:   . Feeling of Stress :   Social Connections:   . Frequency of Communication with Friends and Family:   . Frequency of Social Gatherings with Friends and Family:   . Attends Religious Services:   . Active Member of Clubs or Organizations:   .  Attends Archivist Meetings:   Marland Kitchen Marital Status:    Family History  Problem Relation Age of Onset  . Alzheimer's disease Mother   . CAD Neg Hx    Scheduled Meds: . apixaban  5 mg Oral BID  . atorvastatin  80 mg Oral q1800  . cholecalciferol  2,000 Units Oral Daily  . dextrose      . DULoxetine  40 mg Oral Daily  . feeding supplement (ENSURE ENLIVE)  237 mL Oral BID BM  . furosemide  20 mg Oral Daily  . gabapentin  200 mg Oral QHS  . insulin aspart  0-5 Units Subcutaneous QHS  . insulin aspart  0-9 Units Subcutaneous TID WC  . losartan  25 mg Oral QHS  . magnesium oxide  400 mg Oral BID  . metoprolol tartrate  50 mg Oral BID  . senna-docusate  1 tablet Oral QHS  . sodium chloride flush  3 mL Intravenous Q12H   Continuous Infusions: . sodium chloride    . ciprofloxacin 400 mg (08/03/20 0421)  . dextrose 5 % and 0.45% NaCl 75 mL/hr at 08/03/20 0845  .  diltiazem (CARDIZEM) infusion 10 mg/hr (08/03/20 1446)   PRN Meds:.sodium chloride, acetaminophen **OR** acetaminophen, albuterol, fentaNYL (SUBLIMAZE) injection, nitroGLYCERIN, ondansetron **OR** ondansetron (ZOFRAN) IV, polyethylene glycol, sodium chloride flush, traZODone Allergies  Allergen Reactions  . Amoxicillin Hives  . Morphine And Related     AMS, per son, she pulled out of her IV after morphine, however seems to be able to tolerate hydrocodone-acetaminophen  . Penicillins Hives   Review of Systems  Unable to perform ROS: Mental status change    Physical Exam Constitutional:      General: She is not in acute distress. Pulmonary:     Effort: Pulmonary effort is normal.  Skin:    General: Skin is warm and dry.  Neurological:     Mental Status: She is alert. She is disoriented.     Vital Signs: BP 117/86   Pulse (!) 114   Temp 97.9 F (36.6 C) (Axillary)   Resp (!) 25   Ht 5\' 5"  (1.651 m)   Wt 87.5 kg   SpO2 100%   BMI 32.10 kg/m  Pain Scale: 0-10   Pain Score: 0-No pain   SpO2: SpO2:  100 % O2 Device:SpO2: 100 % O2 Flow Rate: .O2 Flow Rate (L/min): 5 L/min  IO: Intake/output summary:   Intake/Output Summary (Last 24 hours) at 08/03/2020 1516 Last data filed at 08/03/2020 1434 Gross per 24 hour  Intake 846.76 ml  Output --  Net 846.76 ml    LBM: Last BM Date: 08/03/20 Baseline Weight: Weight: 41 kg Most recent weight: Weight: 87.5 kg     Palliative Assessment/Data: PPS 30%   Flowsheet Rows     Most Recent Value  Intake Tab  Referral Department Hospitalist  Unit at Time of Referral Intermediate Care Unit  Palliative Care Primary Diagnosis Cancer  Date Notified 08/02/20  Palliative Care Type New Palliative care  Reason for referral Clarify Goals of Care  Date of Admission 08/01/20  Date first seen by Palliative Care 08/03/20  # of days Palliative referral response time 1 Day(s)  # of days IP prior to Palliative referral 1  Clinical Assessment  Psychosocial & Spiritual Assessment  Palliative Care Outcomes      Time Total: 70 minutes Greater than 50%  of this time was spent counseling and coordinating care related to the above assessment and plan.  Juel Burrow, DNP, AGNP-C Palliative Medicine Team [phone] Pager: [phone]

## 2020-08-03 NOTE — Progress Notes (Signed)
Hypoglycemic Event  CBG: CBG=69  Treatment: D50 25 mL (12.5 gm)  Symptoms: Pale  Follow-up CBG: Time:0008 CBG Result:88  Possible Reasons for Event: Inadequate meal intake  Comments/MD notified:Dr.Chotiner     Angel Allen

## 2020-08-03 NOTE — Progress Notes (Signed)
PROGRESS NOTE    [name]  VVO:[id number] DOB: [birth year] DOA: 08/01/2020 PCP: Deanne Coffer, MD   Brief Narrative: Angel Allen is a 74 y.o. 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  Patient presented secondary to recurrent dislodgment of her tracheostomy tube.   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  Patient states that she is not pulling this out but concerned there could be some confusion. ED replaced trach with 4 cuffless with recommendations to switch back to 4 cuffed shiley per pulmonology. There is no witness with regard to patient pulling out her trach however patient is confused.  Patient transferred from any plan to [facility] for pulmonology evaluation. -Pulmonology recommendations: SLP evaluation for PMV  Confusion I discussion with patient's son.  Patient's son states that she has had some intermittent issues with confusion.  He states that this usually happens when she has urinary tract infections.  Vitamin B12 and folate within normal limits.  TSH within normal limits. Likely underlying element of chronic cognitive impairment with delirium. Urinalysis concerning for infection. Urine culture with multiple species. -Continue Ciprofloxacin -Repeat Urine culture  Chronic atrial fibrillation with RVR Patient was  started on Cardizem drip on admission.  Patient is on Cardizem 30 mg daily as needed in addition to metoprolol 50 mg twice daily.  Rate is improved on Cardizem drip however patient is not taking p.o. reliably -Continue Cardizem drip until confusion improved and patient is taking p.o. more consistently -Continue Eliquis as able  Essential hypertension Patient is on losartan and metoprolol as an outpatient.  Blood pressure is controlled.  Patient did not take her losartan or metoprolol -Continue home losartan and metoprolol as able  Diabetes mellitus, type II Patient is on Lantus 10 units nightly in addition to Humalog sliding scale 2 to 10 units 3 times daily before meals.  Last hemoglobin A1c of 5.9% in July 2021.  Patient with recurrent hypoglycemia in setting of poor oral intake -Continue sliding scale insulin -D5 fluids added  Invasive squamous cell carcinoma of the glottis Patient currently not receiving chemotherapy or radiation.  Not a surgical candidate per chart review. -Palliative care consult  CKD stage IIIb Stable.   DVT prophylaxis: Eliquis Code Status:   Code Status: DNR Family Communication: None at bedside Disposition Plan: Anticipate discharge back to SNF likely in 2 to 3 days pending improvement of mental status   Consultants:   Pulmonology  Palliative care medicine  Procedures:   None  Antimicrobials:  Ciprofloxacin   Subjective: Confused.  Objective: Vitals:   08/03/20 0247 08/03/20 0437 08/03/20 0806 08/03/20 0853  BP:  107/82 113/67   Pulse: 100 96 95 (!) 104  Resp: 18 18 18 18   Temp:  98.1 F (36.7 C) 98 F (36.7 C)   TempSrc:  Axillary Axillary   SpO2: 98% 100% 100% 100%  Weight:  87.5 kg  Height:        Intake/Output Summary (Last 24 hours) at 08/03/2020 1047 Last data filed at 08/02/2020 1728 Gross per 24 hour  Intake 333.54 ml  Output 250 ml  Net 83.54 ml   Filed Weights   08/01/20 2213 08/02/20 0407 08/03/20 0437  Weight:  90 kg 90 kg 87.5 kg    Examination:  General exam: Appears calm and comfortable Respiratory system: Clear to auscultation. Respiratory effort normal. Cardiovascular system: S1 & S2 heard, irregular rhythm with normal rate. No murmurs, rubs, gallops or clicks. Gastrointestinal system: Abdomen is nondistended, soft and nontender. No organomegaly or masses felt. Normal bowel sounds heard. Central nervous system: Alert. Aphonia Musculoskeletal: No edema. No calf tenderness Skin: No cyanosis. No rashes    Data Reviewed: I have personally reviewed following labs and imaging studies  CBC Lab Results  Component Value Date   WBC 8.9 08/01/2020   RBC 3.82 (L) 08/01/2020   HGB 10.4 (L) 08/01/2020   HCT 32.9 (L) 08/01/2020   MCV 86.1 08/01/2020   MCH 27.2 08/01/2020   PLT 260 08/01/2020   MCHC 31.6 08/01/2020   RDW 17.6 (H) 08/01/2020   LYMPHSABS 2.2 08/01/2020   MONOABS 1.2 (H) 08/01/2020   EOSABS 0.2 08/01/2020   BASOSABS 0.0 18/29/9371     Last metabolic panel Lab Results  Component Value Date   NA 141 08/03/2020   K 4.1 08/03/2020   CL 107 08/03/2020   CO2 25 08/03/2020   BUN 13 08/03/2020   CREATININE 1.52 (H) 08/03/2020   GLUCOSE 69 (L) 08/03/2020   GFRNONAA 33 (L) 08/03/2020   GFRAA 38 (L) 08/03/2020   CALCIUM 8.9 08/03/2020   PROT 5.4 (L) 07/07/2020   ALBUMIN 2.2 (L) 07/07/2020   BILITOT 0.7 07/07/2020   ALKPHOS 73 07/07/2020   AST 17 07/07/2020   ALT 12 07/07/2020   ANIONGAP 9 08/03/2020    CBG (last 3)  Recent Labs    08/03/20 0011 08/03/20 0603 08/03/20 0807  GLUCAP 88 68* 97     GFR: Estimated Creatinine Clearance: 34.4 mL/min (A) (by C-G formula based on SCr of 1.52 mg/dL (H)).  Coagulation Profile: Recent Labs  Lab 08/01/20 0637  INR 1.5*    Recent Results (from the past 240 hour(s))  SARS Coronavirus 2 by RT PCR (hospital order, performed in [facility] hospital lab) Nasopharyngeal Nasopharyngeal Swab     Status: None   Collection  Time: 08/01/20  6:21 AM   Specimen: Nasopharyngeal Swab  Result Value Ref Range Status   SARS Coronavirus 2 NEGATIVE NEGATIVE Final    Comment: (NOTE) SARS-CoV-2 target nucleic acids are NOT DETECTED.  The SARS-CoV-2 RNA is generally detectable in upper and lower respiratory specimens during the acute phase of infection. The lowest concentration of SARS-CoV-2 viral copies this assay can detect is 250 copies / mL. A negative result does not preclude SARS-CoV-2 infection and should not be used as the sole basis for treatment or other patient management decisions.  A negative result may occur with improper specimen collection / handling, submission of specimen other than nasopharyngeal swab, presence of viral mutation(s) within the areas targeted by this assay, and inadequate number of viral copies (<250 copies / mL). A negative result must be combined with clinical observations, patient history, and epidemiological information.  Fact Sheet for Patients:   StrictlyIdeas.no  Fact Sheet for Healthcare Providers: BankingDealers.co.za  This test is not yet approved or  cleared by the [place] FDA and has been  authorized for detection and/or diagnosis of SARS-CoV-2 by FDA under an Emergency Use Authorization (EUA).  This EUA will remain in effect (meaning this test can be used) for the duration of the COVID-19 declaration under Section 564(b)(1) of the Act, 21 U.S.C. section 360bbb-3(b)(1), unless the authorization is terminated or revoked sooner.  Performed at [facility], [address] [place], [place] [address]   Culture, Urine     Status: Abnormal   Collection Time: 08/02/20 11:59 AM   Specimen: Urine, Catheterized  Result Value Ref Range Status   Specimen Description URINE, CATHETERIZED  Final   Special Requests   Final    NONE Performed at [facility], [place] [address] [place], [place] [address]    Culture MULTIPLE  SPECIES PRESENT, SUGGEST RECOLLECTION (A)  Final   Report Status 08/03/2020 FINAL  Final        Radiology Studies: CT HEAD WO CONTRAST  Result Date: 08/02/2020 CLINICAL DATA:  Mental status change EXAM: CT HEAD WITHOUT CONTRAST TECHNIQUE: Contiguous axial images were obtained from the base of the skull through the vertex without intravenous contrast. COMPARISON:  None. FINDINGS: Brain: Moderate atrophy. Extensive chronic microvascular ischemic change throughout the white matter and basal ganglia Negative for acute infarct, hemorrhage, mass. Vascular: Negative for hyperdense vessel Skull: Negative Sinuses/Orbits: Mild mucosal edema maxillary sinus bilaterally. Negative orbit. Other: None IMPRESSION: No acute abnormality. Moderate atrophy and extensive chronic microvascular ischemic change. Electronically Signed   By: Franchot Gallo M.D.   On: 08/02/2020 11:09   DG CHEST PORT 1 VIEW  Result Date: 08/02/2020 CLINICAL DATA:  74 year old female with tracheostomy malfunction. Tracheostomy replaced. EXAM: PORTABLE CHEST 1 VIEW COMPARISON:  Portable chest 08/01/2020 and earlier. FINDINGS: Portable AP semi upright view at 0733 hours. Unchanged tracheostomy tube position, no adverse features. Stable lung volumes and mediastinal contours. Stable left chest AICD. Visualized tracheal air column is within normal limits. Allowing for portable technique the lungs are clear. Mildly increased bowel gas in the visible upper abdomen. No acute osseous abnormality identified. IMPRESSION: 1. Tracheostomy tube in place with no adverse features. 2.  No acute cardiopulmonary abnormality. Electronically Signed   By: Genevie Ann M.D.   On: 08/02/2020 07:58        Scheduled Meds: . apixaban  5 mg Oral BID  . atorvastatin  80 mg Oral q1800  . cholecalciferol  2,000 Units Oral Daily  . dextrose      . dextrose      . DULoxetine  40 mg Oral Daily  . feeding supplement (ENSURE ENLIVE)  237 mL Oral BID BM  . furosemide  20 mg  Oral Daily  . gabapentin  200 mg Oral QHS  . insulin aspart  0-5 Units Subcutaneous QHS  . insulin aspart  0-9 Units Subcutaneous TID WC  . losartan  25 mg Oral QHS  . magnesium oxide  400 mg Oral BID  . metoprolol tartrate  50 mg Oral BID  . senna-docusate  1 tablet Oral QHS  . sodium chloride flush  3 mL Intravenous Q12H   Continuous Infusions: . sodium chloride    . ciprofloxacin 400 mg (08/03/20 0421)  . dextrose 5 % and 0.45% NaCl 75 mL/hr at 08/03/20 0845  . diltiazem (CARDIZEM) infusion 10 mg/hr (08/03/20 0200)     LOS: 1 day     Cordelia Poche, MD [facility] 08/03/2020, 10:47 AM  If 7PM-7AM, please contact night-coverage [email or web address]

## 2020-08-03 NOTE — Discharge Instructions (Addendum)

## 2020-08-03 NOTE — Progress Notes (Signed)
Pt with an elevated hr up to the 150s per [facility]. Increased pt's cardizem drip to 12.5mg / hr. Will continue to monitor pt.

## 2020-08-03 NOTE — Evaluation (Signed)
Passy-Muir Speaking Valve - Evaluation Patient Details  Name: Angel Allen MRN: [id number] Date of Birth: Feb 22, 1946  Today's Date: 08/03/2020 Time: 1200-1220 SLP Time Calculation (min) (ACUTE ONLY): 20 min  Past Medical History:  Past Medical History:  Diagnosis Date  . Acute on chronic respiratory failure with hypoxia ([place])   . Acute on chronic respiratory failure with hypoxia ([place])   . Atrial fibrillation ([place])   . Cancer of glottis ([place])   . Cardiomyopathy, awaiting cath possible [facility] 12/21/2014  . Chronic atrial fibrillation (HCC)    on coumadin  . Chronic atrial fibrillation ([place])   . Chronic kidney disease, stage III (moderate)   . CVA (cerebral infarction) 2009  . GERD (gastroesophageal reflux disease)   . Hyperlipidemia   . Hypertension   . Restless leg syndrome   . Systolic congestive heart failure with reduced left ventricular function, NYHA class 3 ([place])   . Type II diabetes mellitus ([place])    Past Surgical History:  Past Surgical History:  Procedure Laterality Date  . CARDIAC CATHETERIZATION  2012-2013   at OSH, no significant blockage, felt 2/2 stress  . LEFT AND RIGHT HEART CATHETERIZATION WITH CORONARY ANGIOGRAM N/A 12/21/2014   Procedure: LEFT AND RIGHT HEART CATHETERIZATION WITH CORONARY ANGIOGRAM;  Surgeon: Leonie Man, MD;  Location: [facility] CATH LAB;  Service: Cardiovascular;  Laterality: N/A;   HPI:  74 y.o. 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  Patient presented secondary to recurrent dislodgment of her tracheostomy tube. Participates in [facility].  Hx radical neck resection and trach at [facility], recently hospitalized  7/8-9 for pulling out her trach. Normally has #4 Shiley, uncuffed; today with #6 uncuffed.   Able to phonate at baseline with audible inspiratory and expiratory wheeze.  Tolerates PMV at baseline.  Concern for tracheal stenosis per 7/21 trach clinic notes. Not a candidate for decannulation NOR for unsupervised use of PMV given her functional limitations and audible wheezing.    Assessment / Plan / Recommendation Clinical Impression  Pt quite confused today, slightly combative and swatting at examiner while trying to place/remove PMV.  With valve in place, pt verbalized "no" multiple times, particularly during gentle encouragement to eat/drink items from lunch tray.  VS were stable with valve in place despite short usage.  Pt's function - soft, low volume, hoarse voice - appears to be at baseline based on chart review.  Recommend that pt use valve when fully supervised by staff.  Remove valve prior to leaving pt alone.  No f/u needed for PMV use/toleration given pt baseline function. SLP will f/u for swallow assessment when MS has improved; recommend continuing efforts to feed.   SLP Visit Diagnosis: Aphonia (R49.1)           Follow Up Recommendations   at SNF             PMSV Trial PMSV was placed for: 10 minutes Able to redirect subglottic air through upper airway: Yes Able to Attain Phonation: Yes Voice Quality: Hoarse;Low vocal intensity Able to Expectorate Secretions: No attempts Breath Support for Phonation: Adequate Intelligibility: Intelligible Respirations During Trial: (!) 33 SpO2 During Trial: 100 % Behavior: Confused   Tracheostomy Tube  Additional Tracheostomy Tube Assessment Fenestrated: No    Vent Dependency  Vent Dependent: No FiO2 (%):  28 %    Cuff Deflation Trial  GO Tolerated Cuff Deflation:  (n/a)        Angel Allen Laurice 08/03/2020, 1:14 PM  08/03/2020 Angel Allen L. Tivis Ringer, [place] Office number [phone] Pager [phone]

## 2020-08-03 NOTE — Progress Notes (Addendum)
Palliative:  Consult received and chart reviewed. No family at bedside. Patient unable to participate in [place] discussion. Called son at number listed - no answer. Voice mail left with call back number. Will await call back and continue to make attempts to reach son.  Juel Burrow, DNP, AGNP-C Palliative Medicine Team Team Phone # [phone]  Pager # [phone]  NO CHARGE

## 2020-08-03 NOTE — Plan of Care (Signed)
  Problem: Education: Goal: Knowledge about tracheostomy care/management will improve Outcome: Not Progressing

## 2020-08-03 NOTE — Progress Notes (Signed)
Initial Nutrition Assessment  DOCUMENTATION CODES:   Not applicable  INTERVENTION:    Offer Ensure Enlive po BID, each supplement provides 350 kcal and 20 grams of protein  NUTRITION DIAGNOSIS:   Inadequate oral intake related to lethargy/confusion as evidenced by other (comment) (refusal of POs).  GOAL:   Patient will meet greater than or equal to 90% of their needs  MONITOR:   PO intake, Labs  REASON FOR ASSESSMENT:   Malnutrition Screening Tool    ASSESSMENT:   74 yo female admitted with recurrent dislodgement of her tracheostomy tube. 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  Patient declined chemotherapy, radiation, and surgery for glottis cancer per review of MD progress notes. Palliative care consult was placed 8/4.   Currently on a heart healthy, CHO modified diet. Patient is refusing all POs, including medications and meals. Ensure Enlive has been ordered BID, but patient has not accepted yet. Spoke with nurse techs at bedside; they have ordered her meals that family says she likes, such as egg salad, but when the food arrives, patient turns her head away and refuses to eat.  Labs reviewed. Creatinine 1.52 CBG: 16-57-90  Medications reviewed and include vitamin D3, Lasix, novolog, Mag-Ox, Senokot-S.  IVF: D5 1/2 NA at 75 ml/h  Per review of usual weights, patient was 104 kg on 06/25/20, down to 90.4 kg 07/06/20, currently 87.5 kg. 16% weight loss within 6 weeks is significant for the time frame.   NUTRITION - FOCUSED PHYSICAL EXAM:  unable to complete, patient uncooperative  Diet Order:   Diet Order            Diet heart healthy/carb modified Room service appropriate? No; Fluid consistency: Thin  Diet effective now                 EDUCATION NEEDS:   Not appropriate for education at this time  Skin:  Skin Assessment: Reviewed RN Assessment  Last BM:   8/4  Height:   Ht Readings from Last 1 Encounters:  08/01/20 5\' 5"  (1.651 m)    Weight:   Wt Readings from Last 1 Encounters:  08/03/20 87.5 kg    Ideal Body Weight:  56.8 kg  BMI:  Body mass index is 32.1 kg/m.  Estimated Nutritional Needs:   Kcal:  1800-2000  Protein:  90-110 gm  Fluid:  1.8-2 L    Lucas Mallow, RD, LDN, CNSC Please refer to Amion for contact information.

## 2020-08-04 LAB — BASIC METABOLIC PANEL
Anion gap: 9 (ref 5–15)
BUN: 9 mg/dL (ref 8–23)
CO2: 24 mmol/L (ref 22–32)
Calcium: 8.6 mg/dL — ABNORMAL LOW (ref 8.9–10.3)
Chloride: 104 mmol/L (ref 98–111)
Creatinine, Ser: 1.43 mg/dL — ABNORMAL HIGH (ref 0.44–1.00)
GFR calc Af Amer: 41 mL/min — ABNORMAL LOW (ref >60)
GFR calc non Af Amer: 35 mL/min — ABNORMAL LOW (ref >60)
Glucose, Bld: 130 mg/dL — ABNORMAL HIGH (ref 70–99)
Potassium: 3.3 mmol/L — ABNORMAL LOW (ref 3.5–5.1)
Sodium: 137 mmol/L (ref 135–145)

## 2020-08-04 LAB — GLUCOSE, CAPILLARY: Glucose-Capillary: 124 mg/dL — ABNORMAL HIGH (ref 70–99)

## 2020-08-04 NOTE — Progress Notes (Signed)
This note also relates to the following rows which could not be included: Pulse Rate - Cannot attach notes to unvalidated device data ECG Heart Rate - Cannot attach notes to unvalidated device data Resp - Cannot attach notes to unvalidated device data SpO2 - Cannot attach notes to unvalidated device data    08/04/20 0736  Assess: MEWS Score  Temp 98.5 F (36.9 C)  BP 113/77  O2 Device Tracheostomy Collar  Patient Activity (if Appropriate) In bed  O2 Flow Rate (L/min) 5 L/min  FiO2 (%) 28 %  Assess: MEWS Score  MEWS Temp 0  MEWS Systolic 0  MEWS Pulse 2  MEWS RR 0  MEWS LOC 0  MEWS Score 2  MEWS Score Color Yellow  Assess: if the MEWS score is Yellow or Red  Were vital signs taken at a resting state? Yes  Focused Assessment No change from prior assessment  Early Detection of Sepsis Score *See Row Information* Medium  MEWS guidelines implemented *See Row Information* Yes  Treat  MEWS Interventions Consulted Respiratory Therapy;Other (Comment) (titrated cardizem drip)  Pain Scale 0-10  Pain Score 0  Neuro symptoms relieved by Rest  Take Vital Signs  Increase Vital Sign Frequency  Yellow: Q 2hr X 2 then Q 4hr X 2, if remains yellow, continue Q 4hrs  Escalate  MEWS: Escalate Yellow: discuss with charge nurse/RN and consider discussing with provider and RRT  Notify: Charge Nurse/RN  Name of Charge Nurse/RN Notified [email or web address],rn  Date Charge Nurse/RN Notified 08/04/20  Time Charge Nurse/RN Notified 0740     08/04/20 0736  Assess: MEWS Score  Temp 98.5 F (36.9 C)  BP 113/77  O2 Device Tracheostomy Collar  Patient Activity (if Appropriate) In bed  O2 Flow Rate (L/min) 5 L/min  FiO2 (%) 28 %  Assess: MEWS Score  MEWS Temp 0  MEWS Systolic 0  MEWS Pulse 2  MEWS RR 0  MEWS LOC 0  MEWS Score 2  MEWS Score Color Yellow  Assess: if the MEWS score is Yellow or Red  Were vital signs taken at a resting state? Yes  Focused Assessment No change from prior assessment  Early  Detection of Sepsis Score *See Row Information* Medium  MEWS guidelines implemented *See Row Information* Yes  Treat  MEWS Interventions Consulted Respiratory Therapy;Other (Comment) (titrated cardizem drip)  Pain Scale 0-10  Pain Score 0  Neuro symptoms relieved by Rest  Take Vital Signs  Increase Vital Sign Frequency  Yellow: Q 2hr X 2 then Q 4hr X 2, if remains yellow, continue Q 4hrs  Escalate  MEWS: Escalate Yellow: discuss with charge nurse/RN and consider discussing with provider and RRT  Notify: Charge Nurse/RN  Name of Charge Nurse/RN Notified kristen,rn  Date Charge Nurse/RN Notified 08/04/20  Time Charge Nurse/RN Notified 0740

## 2020-08-04 NOTE — Progress Notes (Addendum)
Update: 8/6 11:41am-[facility] returned CSWs call. [facility] confirmed that patient is along term resident there. Plan is for patient to return to [facility] with possible hospice services to follow. [facility] will reach out to family about palliative referral.  CSW will continue to follow.   CSW awaitng callback from [facility] to confirm if patient is long term resident there.CSW spoke with Judeen Hammans with [facility] to make palliative referral for patient, for recommendation for hospice services to follow patient at [facility].  CSW will continue to follow.

## 2020-08-04 NOTE — Progress Notes (Signed)
SLP Cancellation Note  Patient Details Name: Angel Allen MRN: [id number] DOB: [birth year]   Cancelled treatment:         Pt refused to eat/drink anything in order to evaluate swallowing. Given admission for pulling out trach but no purported decline in swallowing, recommend continuing POs as pt is willing - will cancel swallow eval orders. NT reports pt ate a sandwich when her son was present.  Please place PMV to facilitate communication, pt should wear valve when there is full supervision.  Per Palliative medicine consult, family does not wish to pursue aggressive interventions.  There are no further acute care SLP needs.  Our service will sign off.  Suleman Gunning L. Tivis Ringer, [place] Office number [phone] Pager [phone]   Angel Allen 08/04/2020, 10:34 AM

## 2020-08-04 NOTE — Progress Notes (Signed)
PROGRESS NOTE    Angel Allen  IOE:[id number] DOB: 1946/04/05 DOA: 08/01/2020 PCP: Deanne Coffer, MD   Brief Narrative: Angel Allen is a 74 y.o. 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  Patient presented secondary to recurrent dislodgment of her tracheostomy tube.   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  Patient states that she is not pulling this out but concerned there could be some confusion. ED replaced trach with 4 cuffless with recommendations to switch back to 4 cuffed shiley per pulmonology. There is no witness with regard to patient pulling out her trach however patient is confused.  Patient transferred from any plan to [facility] for pulmonology evaluation. These episodes appear to be secondary to patient pulling her trach out herself in setting of below.  Confusion I discussion with patient's son.  Patient's son states that she has had some intermittent issues with confusion.  He states that this usually happens when she has urinary tract infections.  Vitamin B12 and folate within normal limits.  TSH within normal limits. Likely underlying element of chronic cognitive impairment with delirium. Urinalysis concerning for  infection. Initial urine culture with multiple species. -Continue Ciprofloxacin -Repeat Urine culture (pending)  Chronic atrial fibrillation with RVR Patient was started on Cardizem drip on admission.  Patient is on Cardizem 30 mg daily as needed in addition to metoprolol 50 mg twice daily.  Rate is improved on Cardizem drip however patient is not taking p.o. reliably -Continue Cardizem drip until confusion improved and patient is taking p.o. more consistently -Continue Eliquis as able  Essential hypertension Patient is on losartan and metoprolol as an outpatient.  Blood pressure is controlled.  Patient did not take her losartan or metoprolol -Continue home losartan and metoprolol as able  Diabetes mellitus, type II Patient is on Lantus 10 units nightly in addition to Humalog sliding scale 2 to 10 units 3 times daily before meals.  Last hemoglobin A1c of 5.9% in July 2021.  Patient with recurrent hypoglycemia in setting of poor oral intake -Continue sliding scale insulin -D5 fluids  Invasive squamous cell carcinoma of the glottis Patient currently not receiving chemotherapy or radiation.  Not a surgical candidate per chart review. -Palliative care consult  CKD stage IIIb Stable.   DVT prophylaxis: Eliquis Code Status:   Code Status: DNR Family Communication: None at bedside Disposition Plan: Anticipate discharge back to SNF likely in 2 to 3 days pending improvement of mental status vs possible hospice facility if does not improve.   Consultants:   Pulmonology  Palliative care medicine  Procedures:   None  Antimicrobials:  Ciprofloxacin   Subjective: Confused.  Objective: Vitals:   08/04/20 0859 08/04/20 1031 08/04/20 1050 08/04/20 1121  BP: 113/77 105/87 105/87   Pulse: (!) 121 Marland Kitchen)  110 (!) 117   Resp: (!) 25 20 18    Temp: 98.3 F (36.8 C) 98.9 F (37.2 C) 97.8 F (36.6 C) (!) 97.3 F (36.3 C)  TempSrc: Axillary Axillary  Axillary  SpO2: 100% 99% 99%     Weight:      Height:        Intake/Output Summary (Last 24 hours) at 08/04/2020 1217 Last data filed at 08/04/2020 0600 Gross per 24 hour  Intake 2310.5 ml  Output --  Net 2310.5 ml   Filed Weights   08/01/20 2213 08/02/20 0407 08/03/20 0437  Weight: 90 kg 90 kg 87.5 kg    Examination:  General exam: Appears calm and comfortable Respiratory system: Clear to auscultation. Respiratory effort normal. Cardiovascular system: S1 & S2 heard, RRR. No murmurs, rubs, gallops or clicks. Gastrointestinal system: Abdomen is nondistended, soft and nontender. No organomegaly or masses felt. Normal bowel sounds heard. Central nervous system: Alert. Musculoskeletal: No edema. No calf tenderness Skin: No cyanosis. No rashes Psychiatry: Judgement and insight appear impaired.    Data Reviewed: I have personally reviewed following labs and imaging studies  CBC Lab Results  Component Value Date   WBC 8.9 08/01/2020   RBC 3.82 (L) 08/01/2020   HGB 10.4 (L) 08/01/2020   HCT 32.9 (L) 08/01/2020   MCV 86.1 08/01/2020   MCH 27.2 08/01/2020   PLT 260 08/01/2020   MCHC 31.6 08/01/2020   RDW 17.6 (H) 08/01/2020   LYMPHSABS 2.2 08/01/2020   MONOABS 1.2 (H) 08/01/2020   EOSABS 0.2 08/01/2020   BASOSABS 0.0 93/81/8299     Last metabolic panel Lab Results  Component Value Date   NA 137 08/04/2020   K 3.3 (L) 08/04/2020   CL 104 08/04/2020   CO2 24 08/04/2020   BUN 9 08/04/2020   CREATININE 1.43 (H) 08/04/2020   GLUCOSE 130 (H) 08/04/2020   GFRNONAA 35 (L) 08/04/2020   GFRAA 41 (L) 08/04/2020   CALCIUM 8.6 (L) 08/04/2020   PROT 5.4 (L) 07/07/2020   ALBUMIN 2.2 (L) 07/07/2020   BILITOT 0.7 07/07/2020   ALKPHOS 73 07/07/2020   AST 17 07/07/2020   ALT 12 07/07/2020   ANIONGAP 9 08/04/2020    CBG (last 3)  Recent Labs    08/03/20 1709 08/03/20 1953 08/04/20 0706  GLUCAP 156* 105* 124*     GFR: Estimated Creatinine Clearance: 36.6 mL/min (A) (by C-G formula based on SCr of  1.43 mg/dL (H)).  Coagulation Profile: Recent Labs  Lab 08/01/20 0637  INR 1.5*    Recent Results (from the past 240 hour(s))  SARS Coronavirus 2 by RT PCR (hospital order, performed in [facility] hospital lab) Nasopharyngeal Nasopharyngeal Swab     Status: None   Collection Time: 08/01/20  6:21 AM   Specimen: Nasopharyngeal Swab  Result Value Ref Range Status   SARS Coronavirus 2 NEGATIVE NEGATIVE Final    Comment: (NOTE) SARS-CoV-2 target nucleic acids are NOT DETECTED.  The SARS-CoV-2 RNA is generally detectable in upper and lower respiratory specimens during the acute phase of infection. The lowest concentration of SARS-CoV-2 viral copies this assay can detect is 250 copies / mL. A negative result does not preclude SARS-CoV-2 infection and should not be used as the sole basis for treatment or other patient management decisions.  A negative result may occur with improper specimen collection / handling, submission of specimen other than nasopharyngeal swab, presence of viral mutation(s) within the areas targeted by this assay, and inadequate number of viral  copies (<250 copies / mL). A negative result must be combined with clinical observations, patient history, and epidemiological information.  Fact Sheet for Patients:   StrictlyIdeas.no  Fact Sheet for Healthcare Providers: BankingDealers.co.za  This test is not yet approved or  cleared by the [place] FDA and has been authorized for detection and/or diagnosis of SARS-CoV-2 by FDA under an Emergency Use Authorization (EUA).  This EUA will remain in effect (meaning this test can be used) for the duration of the COVID-19 declaration under Section 564(b)(1) of the Act, 21 U.S.C. section 360bbb-3(b)(1), unless the authorization is terminated or revoked sooner.  Performed at [facility], [address] [place], [place] [address]   Culture, Urine     Status: Abnormal    Collection Time: 08/02/20 11:59 AM   Specimen: Urine, Catheterized  Result Value Ref Range Status   Specimen Description URINE, CATHETERIZED  Final   Special Requests   Final    NONE Performed at [facility], [place] [address] [place], [place] [address]    Culture MULTIPLE SPECIES PRESENT, SUGGEST RECOLLECTION (A)  Final   Report Status 08/03/2020 FINAL  Final        Radiology Studies: No results found.      Scheduled Meds: . apixaban  5 mg Oral BID  . atorvastatin  80 mg Oral q1800  . cholecalciferol  2,000 Units Oral Daily  . DULoxetine  40 mg Oral Daily  . feeding supplement (ENSURE ENLIVE)  237 mL Oral BID BM  . furosemide  20 mg Oral Daily  . gabapentin  200 mg Oral QHS  . insulin aspart  0-5 Units Subcutaneous QHS  . insulin aspart  0-9 Units Subcutaneous TID WC  . losartan  25 mg Oral QHS  . magnesium oxide  400 mg Oral BID  . metoprolol tartrate  50 mg Oral BID  . QUEtiapine  25 mg Oral QHS  . senna-docusate  1 tablet Oral QHS  . sodium chloride flush  3 mL Intravenous Q12H   Continuous Infusions: . sodium chloride    . ciprofloxacin 400 mg (08/04/20 0451)  . dextrose 5 % and 0.45% NaCl 75 mL/hr at 08/04/20 1113  . diltiazem (CARDIZEM) infusion 15 mg/hr (08/04/20 1116)     LOS: 2 days     Cordelia Poche, MD [facility] 08/04/2020, 12:17 PM  If 7PM-7AM, please contact night-coverage [email or web address]

## 2020-08-04 NOTE — Progress Notes (Addendum)
Manufacturing engineer [facility]) Hospital Liaison Note:  Referral received from [facility])  for hospice services upon discharge pending hospice eligibility.   Reached out to patient NOK/son Angel Allen) to confirm interest and explain services but unsuccessful at this time. Left message with [facility] contact information requesting a call back and will also continue to try to contact.    Plan is to discharge home once medically stable back to [facility]  where patient is a long term care resident - discharge date unknown at this time. An [facility] HLT member will follow daily until discharge.   Once patient is discharged - Please send completed DNR and arrange for any comfort scripts that may be needed for symptom management so there is no lapse in patient comfort prior to hospice services beginning.  Will update this note as needed throughout the day,  Thank you for this referral,   Angel Ponto, RN [place] (in [place]) [phone]  UPDATE at 1339: Spoke with patient's son who confirmed interest in [name] services after discharge - answered questions and supported during discussion.  Patient is hospice eligible per [facility] MD.

## 2020-08-05 ENCOUNTER — Inpatient Hospital Stay (HOSPITAL_COMMUNITY): Payer: Medicare Other

## 2020-08-05 LAB — BASIC METABOLIC PANEL
Anion gap: 10 (ref 5–15)
BUN: 8 mg/dL (ref 8–23)
CO2: 21 mmol/L — ABNORMAL LOW (ref 22–32)
Calcium: 8.4 mg/dL — ABNORMAL LOW (ref 8.9–10.3)
Chloride: 103 mmol/L (ref 98–111)
Creatinine, Ser: 1.34 mg/dL — ABNORMAL HIGH (ref 0.44–1.00)
GFR calc Af Amer: 44 mL/min — ABNORMAL LOW (ref >60)
GFR calc non Af Amer: 38 mL/min — ABNORMAL LOW (ref >60)
Glucose, Bld: 139 mg/dL — ABNORMAL HIGH (ref 70–99)
Potassium: 3.2 mmol/L — ABNORMAL LOW (ref 3.5–5.1)
Sodium: 134 mmol/L — ABNORMAL LOW (ref 135–145)

## 2020-08-05 LAB — MAGNESIUM: Magnesium: 1.2 mg/dL — ABNORMAL LOW (ref 1.7–2.4)

## 2020-08-05 LAB — CBC
HCT: 31.3 % — ABNORMAL LOW (ref 36.0–46.0)
Hemoglobin: 9.9 g/dL — ABNORMAL LOW (ref 12.0–15.0)
MCH: 27.2 pg (ref 26.0–34.0)
MCHC: 31.6 g/dL (ref 30.0–36.0)
MCV: 86 fL (ref 80.0–100.0)
Platelets: 279 10*3/uL (ref 150–400)
RBC: 3.64 MIL/uL — ABNORMAL LOW (ref 3.87–5.11)
RDW: 17.5 % — ABNORMAL HIGH (ref 11.5–15.5)
WBC: 7.3 10*3/uL (ref 4.0–10.5)
nRBC: 0 % (ref 0.0–0.2)

## 2020-08-05 LAB — GLUCOSE, CAPILLARY
Glucose-Capillary: 114 mg/dL — ABNORMAL HIGH (ref 70–99)
Glucose-Capillary: 156 mg/dL — ABNORMAL HIGH (ref 70–99)
Glucose-Capillary: 110 mg/dL — ABNORMAL HIGH (ref 70–99)
Glucose-Capillary: 132 mg/dL — ABNORMAL HIGH (ref 70–99)
Glucose-Capillary: 156 mg/dL — ABNORMAL HIGH (ref 70–99)

## 2020-08-05 MED ORDER — MAGNESIUM SULFATE 2 GM/50ML IV SOLN
2.0000 g | Freq: Once | INTRAVENOUS | Status: AC
  Administered 2020-08-05: 2 g via INTRAVENOUS
  Filled 2020-08-05: qty 50

## 2020-08-05 MED ORDER — POTASSIUM CHLORIDE 10 MEQ/100ML IV SOLN
10.0000 meq | INTRAVENOUS | Status: AC
  Administered 2020-08-05 (×3): 10 meq via INTRAVENOUS
  Filled 2020-08-05 (×2): qty 100

## 2020-08-05 NOTE — Plan of Care (Signed)
  Problem: Respiratory: Goal: Patent airway maintenance will improve Outcome: Progressing   Problem: Clinical Measurements: Goal: Respiratory complications will improve Outcome: Progressing   Problem: Elimination: Goal: Will not experience complications related to bowel motility Outcome: Progressing Goal: Will not experience complications related to urinary retention Outcome: Progressing   Problem: Pain Managment: Goal: General experience of comfort will improve Outcome: Progressing   Problem: Skin Integrity: Goal: Risk for impaired skin integrity will decrease Outcome: Progressing

## 2020-08-05 NOTE — Progress Notes (Signed)
PROGRESS NOTE    Angel Allen  HQI:[id number] DOB: [birth year] DOA: 08/01/2020 PCP: Deanne Coffer, MD   Brief Narrative: Angel Allen is a 74 y.o. 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  Patient presented secondary to recurrent dislodgment of her tracheostomy tube.   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  Patient states that she is not pulling this out but concerned there could be some confusion. ED replaced trach with 4 cuffless with recommendations to switch back to 4 cuffed shiley per pulmonology. There is no witness with regard to patient pulling out her trach however patient is confused.  Patient transferred from any plan to [facility] for pulmonology evaluation. These episodes appear to be secondary to patient pulling her trach out herself in setting of below. Some rhonchi -Chest x-ray  Confusion I discussion with patient's son.  Patient's son states that she has had some intermittent issues with confusion.  He states that this usually happens when she has urinary tract infections.  Vitamin B12 and folate within normal limits.  TSH within normal limits. Likely underlying element of chronic cognitive impairment with  delirium. Urinalysis concerning for infection. Initial urine culture with multiple species. -Continue Ciprofloxacin -Repeat Urine culture (pending)  Chronic atrial fibrillation with RVR Patient was started on Cardizem drip on admission.  Patient is on Cardizem 30 mg daily as needed in addition to metoprolol 50 mg twice daily.  Rate is improved on Cardizem drip however patient is not taking p.o. reliably -Continue Cardizem drip until confusion improved and patient is taking p.o. more consistently -Continue Eliquis as able  Essential hypertension Patient is on losartan and metoprolol as an outpatient.  Blood pressure is controlled.  Patient did not take her losartan or metoprolol -Continue home losartan and metoprolol as able  Diabetes mellitus, type II Patient is on Lantus 10 units nightly in addition to Humalog sliding scale 2 to 10 units 3 times daily before meals.  Last hemoglobin A1c of 5.9% in July 2021.  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  Not a surgical candidate per chart review. -Palliative care recommendations: SNF with hospice  CKD stage IIIb Stable.   DVT prophylaxis: Eliquis Code Status:   Code Status: DNR Family Communication: None at bedside Disposition Plan: Anticipate discharge back to SNF vs hospice facility likely in 1 to 3 days pending improvement of mental status   Consultants:   Pulmonology  Palliative care medicine  Procedures:   None  Antimicrobials:  Ciprofloxacin   Subjective: Confused. Not answering my questions  Objective: Vitals:   08/05/20 1324 08/05/20 4010 08/05/20 2725  08/05/20 0805  BP: 112/82 115/70    Pulse: (!) 106   (!) 107  Resp: 19   19  Temp: 99 F (37.2 C) 98.3 F (36.8 C) 98.6 F (37 C)   TempSrc:  Axillary Oral Axillary   SpO2: 99%   99%  Weight:  90 kg    Height:        Intake/Output Summary (Last 24 hours) at 08/05/2020 1150 Last data filed at 08/05/2020 0558 Gross per 24 hour  Intake 2531.39 ml  Output --  Net 2531.39 ml   Filed Weights   08/02/20 0407 08/03/20 0437 08/05/20 0352  Weight: 90 kg 87.5 kg 90 kg    Examination:  General exam: Appears calm and comfortable Respiratory system: Rhonchi on right side. Respiratory effort normal. Cardiovascular system: S1 & S2 heard, RRR. No murmurs, rubs, gallops or clicks. Gastrointestinal system: Abdomen is nondistended, soft and nontender. No organomegaly or masses felt. Normal bowel sounds heard. Central nervous system: Alert and does not appear oriented. No focal neurological deficits. Musculoskeletal: No edema. No calf tenderness Skin: No cyanosis. No rashes Psychiatry: Judgement and insight appear impaired. Somewhat fidgety.    Data Reviewed: I have personally reviewed following labs and imaging studies  CBC Lab Results  Component Value Date   WBC 7.3 08/05/2020   RBC 3.64 (L) 08/05/2020   HGB 9.9 (L) 08/05/2020   HCT 31.3 (L) 08/05/2020   MCV 86.0 08/05/2020   MCH 27.2 08/05/2020   PLT 279 08/05/2020   MCHC 31.6 08/05/2020   RDW 17.5 (H) 08/05/2020   LYMPHSABS 2.2 08/01/2020   MONOABS 1.2 (H) 08/01/2020   EOSABS 0.2 08/01/2020   BASOSABS 0.0 35/57/3220     Last metabolic panel Lab Results  Component Value Date   NA 134 (L) 08/05/2020   K 3.2 (L) 08/05/2020   CL 103 08/05/2020   CO2 21 (L) 08/05/2020   BUN 8 08/05/2020   CREATININE 1.34 (H) 08/05/2020   GLUCOSE 139 (H) 08/05/2020   GFRNONAA 38 (L) 08/05/2020   GFRAA 44 (L) 08/05/2020   CALCIUM 8.4 (L) 08/05/2020   PROT 5.4 (L) 07/07/2020   ALBUMIN 2.2 (L) 07/07/2020   BILITOT 0.7 07/07/2020   ALKPHOS 73 07/07/2020   AST 17 07/07/2020   ALT 12 07/07/2020   ANIONGAP 10 08/05/2020    CBG (last 3)  Recent Labs    08/04/20 1551 08/04/20 2019  08/05/20 0821  GLUCAP 156* 110* 132*     GFR: Estimated Creatinine Clearance: 39.6 mL/min (A) (by C-G formula based on SCr of 1.34 mg/dL (H)).  Coagulation Profile: Recent Labs  Lab 08/01/20 0637  INR 1.5*    Recent Results (from the past 240 hour(s))  SARS Coronavirus 2 by RT PCR (hospital order, performed in [facility] hospital lab) Nasopharyngeal Nasopharyngeal Swab     Status: None   Collection Time: 08/01/20  6:21 AM   Specimen: Nasopharyngeal Swab  Result Value Ref Range Status   SARS Coronavirus 2 NEGATIVE NEGATIVE Final    Comment: (NOTE) SARS-CoV-2 target nucleic acids are NOT DETECTED.  The SARS-CoV-2 RNA is generally detectable in upper and lower respiratory specimens during the acute phase of infection. The lowest concentration of SARS-CoV-2 viral copies this assay can detect is 250 copies / mL. A negative result does not preclude SARS-CoV-2 infection and should not be used as the sole basis for treatment or other patient management decisions.  A negative result may occur with improper specimen collection / handling, submission of  specimen other than nasopharyngeal swab, presence of viral mutation(s) within the areas targeted by this assay, and inadequate number of viral copies (<250 copies / mL). A negative result must be combined with clinical observations, patient history, and epidemiological information.  Fact Sheet for Patients:   StrictlyIdeas.no  Fact Sheet for Healthcare Providers: BankingDealers.co.za  This test is not yet approved or  cleared by the [place] FDA and has been authorized for detection and/or diagnosis of SARS-CoV-2 by FDA under an Emergency Use Authorization (EUA).  This EUA will remain in effect (meaning this test can be used) for the duration of the COVID-19 declaration under Section 564(b)(1) of the Act, 21 U.S.C. section 360bbb-3(b)(1), unless the authorization is terminated  or revoked sooner.  Performed at [facility], [address] [place], [place] [address]   Culture, Urine     Status: Abnormal   Collection Time: 08/02/20 11:59 AM   Specimen: Urine, Catheterized  Result Value Ref Range Status   Specimen Description URINE, CATHETERIZED  Final   Special Requests   Final    NONE Performed at [facility], [place] [address], [place], [place] [address]    Culture MULTIPLE SPECIES PRESENT, SUGGEST RECOLLECTION (A)  Final   Report Status 08/03/2020 FINAL  Final        Radiology Studies: No results found.      Scheduled Meds: . apixaban  5 mg Oral BID  . atorvastatin  80 mg Oral q1800  . cholecalciferol  2,000 Units Oral Daily  . DULoxetine  40 mg Oral Daily  . feeding supplement (ENSURE ENLIVE)  237 mL Oral BID BM  . furosemide  20 mg Oral Daily  . gabapentin  200 mg Oral QHS  . insulin aspart  0-5 Units Subcutaneous QHS  . insulin aspart  0-9 Units Subcutaneous TID WC  . losartan  25 mg Oral QHS  . magnesium oxide  400 mg Oral BID  . metoprolol tartrate  50 mg Oral BID  . QUEtiapine  25 mg Oral QHS  . senna-docusate  1 tablet Oral QHS  . sodium chloride flush  3 mL Intravenous Q12H   Continuous Infusions: . sodium chloride    . ciprofloxacin 400 mg (08/05/20 0400)  . dextrose 5 % and 0.45% NaCl 75 mL/hr at 08/05/20 0101  . diltiazem (CARDIZEM) infusion 15 mg/hr (08/05/20 0518)     LOS: 3 days     Cordelia Poche, MD [facility] 08/05/2020, 11:50 AM  If 7PM-7AM, please contact night-coverage [email or web address]

## 2020-08-06 LAB — BASIC METABOLIC PANEL
Anion gap: 13 (ref 5–15)
BUN: 6 mg/dL — ABNORMAL LOW (ref 8–23)
CO2: 18 mmol/L — ABNORMAL LOW (ref 22–32)
Calcium: 8.5 mg/dL — ABNORMAL LOW (ref 8.9–10.3)
Chloride: 102 mmol/L (ref 98–111)
Creatinine, Ser: 1.41 mg/dL — ABNORMAL HIGH (ref 0.44–1.00)
GFR calc Af Amer: 42 mL/min — ABNORMAL LOW (ref >60)
GFR calc non Af Amer: 36 mL/min — ABNORMAL LOW (ref >60)
Glucose, Bld: 178 mg/dL — ABNORMAL HIGH (ref 70–99)
Potassium: 3.6 mmol/L (ref 3.5–5.1)
Sodium: 133 mmol/L — ABNORMAL LOW (ref 135–145)

## 2020-08-06 LAB — MAGNESIUM: Magnesium: 1.4 mg/dL — ABNORMAL LOW (ref 1.7–2.4)

## 2020-08-06 LAB — GLUCOSE, CAPILLARY
Glucose-Capillary: 148 mg/dL — ABNORMAL HIGH (ref 70–99)
Glucose-Capillary: 166 mg/dL — ABNORMAL HIGH (ref 70–99)
Glucose-Capillary: 149 mg/dL — ABNORMAL HIGH (ref 70–99)
Glucose-Capillary: 110 mg/dL — ABNORMAL HIGH (ref 70–99)
Glucose-Capillary: 221 mg/dL — ABNORMAL HIGH (ref 70–99)
Glucose-Capillary: 97 mg/dL (ref 70–99)

## 2020-08-06 MED ORDER — MAGNESIUM SULFATE 4 GM/100ML IV SOLN
4.0000 g | Freq: Once | INTRAVENOUS | Status: AC
  Administered 2020-08-06: 4 g via INTRAVENOUS
  Filled 2020-08-06: qty 100

## 2020-08-06 NOTE — Progress Notes (Signed)
PROGRESS NOTE    Angel Allen  GUY:[id number] DOB: May 08, 1946 DOA: 08/01/2020 PCP: Deanne Coffer, MD   Brief Narrative: Angel Allen is a 74 y.o. 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  Patient presented secondary to recurrent dislodgment of her tracheostomy tube.   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  Patient states that she is not pulling this out but concerned there could be some confusion. ED replaced trach with 4 cuffless with recommendations to switch back to 4 cuffed shiley per pulmonology. There is no witness with regard to patient pulling out her trach however patient is confused.  Patient transferred from any plan to [facility] for pulmonology evaluation. These episodes appear to be secondary to patient pulling her trach out herself in setting of below. Some rhonchi. Chest x-ray stable.  Confusion I discussion with patient's son.  Patient's son states that she has had some intermittent issues with confusion.  He states that this usually happens when she has urinary tract infections.  Vitamin B12 and folate within normal limits.  TSH within normal limits. Likely underlying element of chronic cognitive impairment with  delirium. Urinalysis concerning for infection. Initial urine culture with multiple species. -Continue Ciprofloxacin -Repeat Urine culture (pending)  Chronic atrial fibrillation with RVR Patient was started on Cardizem drip on admission.  Patient is on Cardizem 30 mg daily as needed in addition to metoprolol 50 mg twice daily.  Rate is improved on Cardizem drip however patient is not taking p.o. reliably -Continue Cardizem drip until confusion improved and patient is taking p.o. more consistently -Continue Eliquis as able  Essential hypertension Patient is on losartan and metoprolol as an outpatient.  Blood pressure is controlled.  Patient did not take her losartan or metoprolol -Continue home losartan and metoprolol as able  Diabetes mellitus, type II Patient is on Lantus 10 units nightly in addition to Humalog sliding scale 2 to 10 units 3 times daily before meals.  Last hemoglobin A1c of 5.9% in July 2021.  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  Not a surgical candidate per chart review. -Palliative care recommendations: SNF with hospice  CKD stage IIIb Stable.   DVT prophylaxis: Eliquis as able; SCDs Code Status:   Code Status: DNR Family Communication: Sone on telephone Disposition Plan: Anticipate discharge back to SNF vs hospice facility likely in 1 to 2 days pending improvement of mental status if possible   Consultants:   Pulmonology  Palliative care medicine  Procedures:   None  Antimicrobials:  Ciprofloxacin   Subjective: Patient nods when asked if she is hungry. Does not  answer other questions  Objective: Vitals:   08/06/20 0904 08/06/20 0954 08/06/20 1100 08/06/20 1216  BP: (!) 119/59 100/63  (!) 119/56  Pulse: 95 90 99 (!) 109  Resp: 16  16 20 16   Temp:    98.5 F (36.9 C)  TempSrc:    Axillary  SpO2: 98% 94% 97% 98%  Weight:      Height:        Intake/Output Summary (Last 24 hours) at 08/06/2020 1259 Last data filed at 08/06/2020 1000 Gross per 24 hour  Intake 1883.8 ml  Output --  Net 1883.8 ml   Filed Weights   08/03/20 0437 08/05/20 0352 08/06/20 0508  Weight: 87.5 kg 90 kg 89.4 kg    Examination:  General exam: Appears calm and comfortable Respiratory system: Clear to auscultation. Respiratory effort normal. Cardiovascular system: S1 & S2 heard, RRR. No murmurs, rubs, gallops or clicks. Gastrointestinal system: Abdomen is nondistended, soft and nontender. No organomegaly or masses felt. Normal bowel sounds heard. Central nervous system: Alert. Follows commands. Musculoskeletal: No edema. No calf tenderness Skin: No cyanosis. No rashes     Data Reviewed: I have personally reviewed following labs and imaging studies  CBC Lab Results  Component Value Date   WBC 7.3 08/05/2020   RBC 3.64 (L) 08/05/2020   HGB 9.9 (L) 08/05/2020   HCT 31.3 (L) 08/05/2020   MCV 86.0 08/05/2020   MCH 27.2 08/05/2020   PLT 279 08/05/2020   MCHC 31.6 08/05/2020   RDW 17.5 (H) 08/05/2020   LYMPHSABS 2.2 08/01/2020   MONOABS 1.2 (H) 08/01/2020   EOSABS 0.2 08/01/2020   BASOSABS 0.0 32/67/1245     Last metabolic panel Lab Results  Component Value Date   NA 133 (L) 08/06/2020   K 3.6 08/06/2020   CL 102 08/06/2020   CO2 18 (L) 08/06/2020   BUN 6 (L) 08/06/2020   CREATININE 1.41 (H) 08/06/2020   GLUCOSE 178 (H) 08/06/2020   GFRNONAA 36 (L) 08/06/2020   GFRAA 42 (L) 08/06/2020   CALCIUM 8.5 (L) 08/06/2020   PROT 5.4 (L) 07/07/2020   ALBUMIN 2.2 (L) 07/07/2020   BILITOT 0.7 07/07/2020   ALKPHOS 73 07/07/2020   AST 17 07/07/2020   ALT 12 07/07/2020   ANIONGAP 13 08/06/2020    CBG (last 3)  Recent Labs    08/05/20 2046 08/06/20 0907 08/06/20 1213  GLUCAP 166* 149* 110*     GFR: Estimated Creatinine  Clearance: 37.5 mL/min (A) (by C-G formula based on SCr of 1.41 mg/dL (H)).  Coagulation Profile: Recent Labs  Lab 08/01/20 0637  INR 1.5*    Recent Results (from the past 240 hour(s))  SARS Coronavirus 2 by RT PCR (hospital order, performed in [facility] hospital lab) Nasopharyngeal Nasopharyngeal Swab     Status: None   Collection Time: 08/01/20  6:21 AM   Specimen: Nasopharyngeal Swab  Result Value Ref Range Status   SARS Coronavirus 2 NEGATIVE NEGATIVE Final    Comment: (NOTE) SARS-CoV-2 target nucleic acids are NOT DETECTED.  The SARS-CoV-2 RNA is generally detectable in upper and lower respiratory specimens during the acute phase of infection. The lowest concentration of SARS-CoV-2 viral copies this assay can detect is 250 copies / mL. A negative result does not preclude SARS-CoV-2 infection and should not be used as the sole basis for treatment or other patient management decisions.  A negative result may occur with improper specimen collection / handling, submission of specimen other than nasopharyngeal swab, presence of  viral mutation(s) within the areas targeted by this assay, and inadequate number of viral copies (<250 copies / mL). A negative result must be combined with clinical observations, patient history, and epidemiological information.  Fact Sheet for Patients:   StrictlyIdeas.no  Fact Sheet for Healthcare Providers: BankingDealers.co.za  This test is not yet approved or  cleared by the [place] FDA and has been authorized for detection and/or diagnosis of SARS-CoV-2 by FDA under an Emergency Use Authorization (EUA).  This EUA will remain in effect (meaning this test can be used) for the duration of the COVID-19 declaration under Section 564(b)(1) of the Act, 21 U.S.C. section 360bbb-3(b)(1), unless the authorization is terminated or revoked sooner.  Performed at [facility], [address]  [place], [place] [address]   Culture, Urine     Status: Abnormal   Collection Time: 08/02/20 11:59 AM   Specimen: Urine, Catheterized  Result Value Ref Range Status   Specimen Description URINE, CATHETERIZED  Final   Special Requests   Final    NONE Performed at [facility], [place] [address] [place], [place] [address]    Culture MULTIPLE SPECIES PRESENT, SUGGEST RECOLLECTION (A)  Final   Report Status 08/03/2020 FINAL  Final        Radiology Studies: DG CHEST PORT 1 VIEW  Result Date: 08/05/2020 CLINICAL DATA:  Dislodged tracheostomy EXAM: PORTABLE CHEST 1 VIEW COMPARISON:  08/02/2020 chest radiograph. FINDINGS: Tracheostomy tube tip overlies the tracheal air column at the thoracic inlet. Stable single lead left subclavian ICD. Stable cardiomediastinal silhouette with moderate cardiomegaly. No pneumothorax. No pleural effusion. No overt pulmonary edema. Mild elevation of the left hemidiaphragm. Minimal bibasilar atelectasis. IMPRESSION: 1. Tracheostomy tube tip overlies the tracheal air column at the thoracic inlet. 2. Stable moderate cardiomegaly without overt pulmonary edema. 3. Minimal bibasilar atelectasis. Electronically Signed   By: Ilona Sorrel M.D.   On: 08/05/2020 14:01        Scheduled Meds: . apixaban  5 mg Oral BID  . atorvastatin  80 mg Oral q1800  . cholecalciferol  2,000 Units Oral Daily  . DULoxetine  40 mg Oral Daily  . feeding supplement (ENSURE ENLIVE)  237 mL Oral BID BM  . gabapentin  200 mg Oral QHS  . insulin aspart  0-5 Units Subcutaneous QHS  . insulin aspart  0-9 Units Subcutaneous TID WC  . losartan  25 mg Oral QHS  . magnesium oxide  400 mg Oral BID  . metoprolol tartrate  50 mg Oral BID  . QUEtiapine  25 mg Oral QHS  . senna-docusate  1 tablet Oral QHS  . sodium chloride flush  3 mL Intravenous Q12H   Continuous Infusions: . sodium chloride    . ciprofloxacin Stopped (08/06/20 9147)  . dextrose 5 % and 0.45% NaCl 75 mL/hr at 08/06/20 1204  .  diltiazem (CARDIZEM) infusion Stopped (08/06/20 0954)  . magnesium sulfate bolus IVPB 4 g (08/06/20 1222)     LOS: 4 days     Cordelia Poche, MD [facility] 08/06/2020, 12:59 PM  If 7PM-7AM, please contact night-coverage [email or web address]

## 2020-08-06 NOTE — Progress Notes (Signed)
Manufacturing engineer [facility]) Hospital Liaison note.    Received request from [place] for family interest in [facility].  . Chart being reviewed; eligibility pending at this time. Spoke with son Darnell Level to confirm interest and explain services.    Unfortunately, [facility] is unable to offer a room today. Pending eligibility, hospital Liaison will follow up tomorrow or sooner if a room becomes available.    Please do not hesitate to call with questions.    Thank you for the opportunity to participate in this patients care.  Chrislyn Edison Pace, BSN, RN [place] (listed on [place] under Hospice/[facility])    [phone]

## 2020-08-06 NOTE — Plan of Care (Signed)
  Problem: Respiratory: Goal: Patent airway maintenance will improve Outcome: Progressing   Problem: Clinical Measurements: Goal: Respiratory complications will improve Outcome: Progressing   Problem: Nutrition: Goal: Adequate nutrition will be maintained Outcome: Not Progressing Note: Pt refusing any nutrition at this time. MD aware

## 2020-08-07 DIAGNOSIS — Z930 Tracheostomy status: Secondary | ICD-10-CM

## 2020-08-07 LAB — GLUCOSE, CAPILLARY
Glucose-Capillary: 102 mg/dL — ABNORMAL HIGH (ref 70–99)
Glucose-Capillary: 132 mg/dL — ABNORMAL HIGH (ref 70–99)
Glucose-Capillary: 115 mg/dL — ABNORMAL HIGH (ref 70–99)
Glucose-Capillary: 112 mg/dL — ABNORMAL HIGH (ref 70–99)

## 2020-08-07 NOTE — Progress Notes (Signed)
Nettey,MD returned paged. Stated to continue Cardizem gtt at this time. Also aware pt is refusing PO medications. Will continue to monitor closely.

## 2020-08-07 NOTE — Progress Notes (Signed)
Palliative Medicine RN Note: Pt is difficult to wake, in mitts. Son at bedside. Pt remains on Cardizem drip.  Spoke with son. He understands that she is not eating and that SNF likely won't take her with so little intake, thus the interest in [facility]. Plan to continue current treatments until transfer to either residential hospice or SNF with hospice.  Marjie Skiff Vergene Marland, RN, BSN, [facility] Palliative Medicine Team 08/07/2020 3:53 PM Office [phone]

## 2020-08-07 NOTE — TOC Progression Note (Addendum)
Transition of Care [facility]) - Progression Note    Patient Details  Name: Angel Allen MRN: [id number] Date of Birth: [birth year]  Transition of Care [facility]) CM/SW [place], [place] Phone Number: 08/07/2020, 1:05 PM  Clinical Narrative:     Chrislyn with [facility] confimed with CSW there is no bed availability today at [facility]. CSW will follow up with [facility] tomorrow for bed availability.  CSW will continue to follow.       Expected Discharge Plan and Services                                                 Social Determinants of Health (SDOH) Interventions    Readmission Risk Interventions Readmission Risk Prevention Plan 07/07/2020  Transportation Screening Complete  PCP or Specialist Appt within 3-5 Days Complete  HRI or [place] Complete  Social Work Consult for [place] Planning/Counseling Complete  Palliative Care Screening Not Applicable  Medication Review Press photographer) Complete  Some recent data might be hidden

## 2020-08-07 NOTE — Progress Notes (Signed)
NAME:  Angel Allen, MRN:  [id number], DOB:  [birth year], LOS: 5 ADMISSION DATE:  08/01/2020, CONSULTATION DATE:  08/02/2020 REFERRING MD:  Dr. Lonny Prude, CHIEF COMPLAINT:  Trach dependent    Brief History   74yo female presents from [facility] skilled nursing facility after dislodgment of chronic trach. PCCM consulted for trach management.   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 Other significant history includes prior CVA with right sided weakness, chronic A-fib, nonischemic cardiomyopathy, type 2 diabetes, depression, GERD, and stage IIIb CKD. Patient presented with dislodgement/self removal of trach at SNF, this was replaced in ED with 4.0 cuffless  Trach with no complications seen. Patient was also seen to be in A-fib RVR for which she was started on IV cardizem drip and admitted to hopsitalist team. PCCM was consulted for trach management.   Past Medical History   Past Medical History:  Diagnosis Date  . Acute on chronic respiratory failure with hypoxia ([place])   . Acute on chronic respiratory failure with hypoxia ([place])   . Atrial fibrillation ([place])   . Cancer of glottis ([place])   . Cardiomyopathy, awaiting cath possible [facility] 12/21/2014  . Chronic atrial fibrillation (HCC)    on coumadin  . Chronic atrial fibrillation ([place])   . Chronic kidney disease, stage III (moderate)   . CVA (cerebral infarction) 2009  . GERD (gastroesophageal reflux disease)   . Hyperlipidemia   . Hypertension   . Restless leg syndrome   . 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 Type II diabetes mellitus ([place])     Significant Hospital Events   Admitted 8/4  Consults:  PCCM   Procedures:  None  Significant Diagnostic Tests:  CXR 8/4 > negative  Head CT 8/4 > Negative   Micro  Data:  COVID 8/3 > negative   Antimicrobials:     Interim history/subjective:   Remains on Cardizem drip No distress Afebrile  Objective   Blood pressure 103/69, pulse 82, temperature (!) 97.4 F (36.3 C), temperature source Axillary, resp. rate 14, height 5\' 5"  (1.651 m), weight 93.9 kg, SpO2 99 %.    FiO2 (%):  [28 %] 28 %  No intake or output data in the 24 hours ending 08/07/20 1234 Filed Weights   08/05/20 0352 08/06/20 0508 08/07/20 0442  Weight: 90 kg 89.4 kg 93.9 kg    Examination: General: Chronically ill appearing elderly female lying in bed in NAD HEENT: MC/AT, MM pink/moist, PERRL, sclera non-icteric   Neuro: Awake, able to mouth words, confused, mittens on hands CV: s1s2 regular rate and rhythm, no murmur, rubs, or gallops,  PULM: no accessory muscle use, decreased breath sounds bilateral GI: soft, bowel sounds active in all 4 quadrants, non-tender, non-distended Extremities: warm/dry, no edema  Skin: no rashes or lesions   Resolved Hospital Problem list     Assessment & Plan:  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  I presume that means that should the tracheostomy get dislodged again we will not be  reinserting this. PCCM available as needed     Best practice:   DVT prophylaxis: Eliquis  Code Status: DNR/DNI Family Communication: Per primary  Disposition: SNF with hospice  Kara Mead MD. FCCP. [place]  If no response to pager , please call [phone]   08/07/2020

## 2020-08-07 NOTE — Progress Notes (Signed)
Manufacturing engineer [facility]) Hospital Liaison note.    Regretfully, [place] is unable to offer a room today. Hospital Liaison will follow up tomorrow or sooner if a room becomes available.    Please do not hesitate to call with questions.    Thank you for the opportunity to participate in this patient's care.  Chrislyn Edison Pace, BSN, RN [place] (listed on [place] under [facility])    [phone]

## 2020-08-07 NOTE — Progress Notes (Signed)
Pt very drowsy. BP 89/60, HR 101. Cardizem stopped d/t BP. Pt refusing all PO medications and breakfast. Lonny Prude, MD notified and will wait for response before restarting gtt. Will continue to monitor closely.

## 2020-08-07 NOTE — Progress Notes (Signed)
PROGRESS NOTE    Angel Allen  OEU:[id number] DOB: 1946/02/13 DOA: 08/01/2020 PCP: Deanne Coffer, MD   Brief Narrative: Angel Allen is a 74 y.o. 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  Patient presented secondary to recurrent dislodgment of her tracheostomy tube.   Assessment & Plan:   Principal Problem:   Tracheal stricture Active Problems:   Acute on chronic respiratory failure with hypoxia (HCC)   Cancer of glottis (HCC)   Chronic kidney disease, stage III (moderate)   Chronic atrial fibrillation with rapid ventricular response ([place])   Tracheostomy in place [facility])   DNR (do not resuscitate)   DNI (do not intubate)   Tracheostomy dependence ([place])   Tracheostomy malfunction ([place])   Goals of care, counseling/discussion   Palliative care by specialist   Tracheostomy in place Tracheostomy dependence Patient with recurrent dislodgment of her trach.  Patient states that she is not pulling this out but concerned there could be some confusion. ED replaced trach with 4 cuffless with recommendations to switch back to 4 cuffed shiley per pulmonology. There is no witness with regard to patient pulling out her trach however patient is confused.  Patient transferred from any plan to [facility] for pulmonology evaluation. These episodes appear to be secondary to patient pulling her trach out herself in setting of below. Some rhonchi. Chest x-ray stable.  Confusion I discussion with patient's son.  Patient's son states that she has had some intermittent issues with confusion.  He states that this usually happens when she has urinary tract infections.  Vitamin B12 and folate within normal limits.  TSH within normal limits. Likely underlying element of chronic cognitive impairment with  delirium. Urinalysis concerning for infection. Initial urine culture with multiple species. Repeat urine culture (8/6) apparently not received by lab. -Continue Ciprofloxacin x7 days  Chronic atrial fibrillation with RVR Patient was started on Cardizem drip on admission.  Patient is on Cardizem 30 mg daily as needed in addition to metoprolol 50 mg twice daily.  Rate is improved on Cardizem drip however patient is not taking p.o. reliably -Continue Cardizem drip until confusion improved and patient is taking p.o. more consistently -Continue Eliquis as able  Essential hypertension Patient is on losartan and metoprolol as an outpatient.  Blood pressure is controlled.  Patient did not take her losartan or metoprolol -Continue home losartan and metoprolol as able  Diabetes mellitus, type II Patient is on Lantus 10 units nightly in addition to Humalog sliding scale 2 to 10 units 3 times daily before meals.  Last hemoglobin A1c of 5.9% in July 2021.  Patient with recurrent hypoglycemia in setting of poor oral intake which has improved on D5 IV fluids -Continue sliding scale insulin -D5 IV fluids  Invasive squamous cell carcinoma of the glottis Patient currently not receiving chemotherapy or radiation.  Not a surgical candidate per chart review. -Palliative care recommendations: SNF with hospice  CKD stage IIIb Stable.   DVT prophylaxis: Eliquis as able; SCDs Code Status:   Code Status: DNR Family Communication: None on telephone Disposition Plan: Anticipate discharge likely to hospice facility as patient continues to refuse oral intake and treatments are ineffective. Discharge when bed is available.   Consultants:   Pulmonology  Palliative care medicine  Procedures:   None  Antimicrobials:  Ciprofloxacin   Subjective: No  issues. Not taking medications or food.  Objective: Vitals:   08/07/20 0939 08/07/20 0959 08/07/20 1120 08/07/20 1127  BP: (!) 78/65 (!) 89/60  (!) 96/51   Pulse:  (!) 117 90 (!) 108  Resp:  13 13 12   Temp:      TempSrc:      SpO2:  100% 99% 99%  Weight:      Height:        Intake/Output Summary (Last 24 hours) at 08/07/2020 1149 Last data filed at 08/06/2020 1213 Gross per 24 hour  Intake --  Output 1 ml  Net -1 ml   Filed Weights   08/05/20 0352 08/06/20 0508 08/07/20 0442  Weight: 90 kg 89.4 kg 93.9 kg    Examination:  General exam: Appears calm and comfortable Respiratory system: Clear to auscultation. Respiratory effort normal. Cardiovascular system: S1 & S2 heard, RRR. No murmurs, rubs, gallops or clicks. Gastrointestinal system: Abdomen is nondistended, soft and nontender. No organomegaly or masses felt. Normal bowel sounds heard. Central nervous system: Alert. Musculoskeletal: No calf tenderness Skin: No cyanosis. No rashes    Data Reviewed: I have personally reviewed following labs and imaging studies  CBC Lab Results  Component Value Date   WBC 7.3 08/05/2020   RBC 3.64 (L) 08/05/2020   HGB 9.9 (L) 08/05/2020   HCT 31.3 (L) 08/05/2020   MCV 86.0 08/05/2020   MCH 27.2 08/05/2020   PLT 279 08/05/2020   MCHC 31.6 08/05/2020   RDW 17.5 (H) 08/05/2020   LYMPHSABS 2.2 08/01/2020   MONOABS 1.2 (H) 08/01/2020   EOSABS 0.2 08/01/2020   BASOSABS 0.0 92/42/6834     Last metabolic panel Lab Results  Component Value Date   NA 133 (L) 08/06/2020   K 3.6 08/06/2020   CL 102 08/06/2020   CO2 18 (L) 08/06/2020   BUN 6 (L) 08/06/2020   CREATININE 1.41 (H) 08/06/2020   GLUCOSE 178 (H) 08/06/2020   GFRNONAA 36 (L) 08/06/2020   GFRAA 42 (L) 08/06/2020   CALCIUM 8.5 (L) 08/06/2020   PROT 5.4 (L) 07/07/2020   ALBUMIN 2.2 (L) 07/07/2020   BILITOT 0.7 07/07/2020   ALKPHOS 73 07/07/2020   AST 17 07/07/2020   ALT 12 07/07/2020   ANIONGAP 13 08/06/2020    CBG (last 3)  Recent Labs    08/06/20 1726 08/06/20 2105 08/07/20 0817  GLUCAP 221* 97 102*     GFR: Estimated Creatinine Clearance: 38.5 mL/min (A)  (by C-G formula based on SCr of 1.41 mg/dL (H)).  Coagulation Profile: Recent Labs  Lab 08/01/20 0637  INR 1.5*    Recent Results (from the past 240 hour(s))  SARS Coronavirus 2 by RT PCR (hospital order, performed in [facility] hospital lab) Nasopharyngeal Nasopharyngeal Swab     Status: None   Collection Time: 08/01/20  6:21 AM   Specimen: Nasopharyngeal Swab  Result Value Ref Range Status   SARS Coronavirus 2 NEGATIVE NEGATIVE Final    Comment: (NOTE) SARS-CoV-2 target nucleic acids are NOT DETECTED.  The SARS-CoV-2 RNA is generally detectable in upper and lower respiratory specimens during the acute phase of infection. The lowest concentration of SARS-CoV-2 viral copies this assay can detect is 250 copies / mL. A negative result does not preclude SARS-CoV-2 infection and should not be used as the sole basis for treatment or other patient management decisions.  A negative result may occur with improper specimen collection / handling, submission of specimen other than nasopharyngeal swab, presence of viral mutation(s) within  the areas targeted by this assay, and inadequate number of viral copies (<250 copies / mL). A negative result must be combined with clinical observations, patient history, and epidemiological information.  Fact Sheet for Patients:   StrictlyIdeas.no  Fact Sheet for Healthcare Providers: BankingDealers.co.za  This test is not yet approved or  cleared by the [place] FDA and has been authorized for detection and/or diagnosis of SARS-CoV-2 by FDA under an Emergency Use Authorization (EUA).  This EUA will remain in effect (meaning this test can be used) for the duration of the COVID-19 declaration under Section 564(b)(1) of the Act, 21 U.S.C. section 360bbb-3(b)(1), unless the authorization is terminated or revoked sooner.  Performed at [facility], [address], [place], [place] [address]    Culture, Urine     Status: Abnormal   Collection Time: 08/02/20 11:59 AM   Specimen: Urine, Catheterized  Result Value Ref Range Status   Specimen Description URINE, CATHETERIZED  Final   Special Requests   Final    NONE Performed at [facility], [place] [address] [place], [place] [address]    Culture MULTIPLE SPECIES PRESENT, SUGGEST RECOLLECTION (A)  Final   Report Status 08/03/2020 FINAL  Final        Radiology Studies: DG CHEST PORT 1 VIEW  Result Date: 08/05/2020 CLINICAL DATA:  Dislodged tracheostomy EXAM: PORTABLE CHEST 1 VIEW COMPARISON:  08/02/2020 chest radiograph. FINDINGS: Tracheostomy tube tip overlies the tracheal air column at the thoracic inlet. Stable single lead left subclavian ICD. Stable cardiomediastinal silhouette with moderate cardiomegaly. No pneumothorax. No pleural effusion. No overt pulmonary edema. Mild elevation of the left hemidiaphragm. Minimal bibasilar atelectasis. IMPRESSION: 1. Tracheostomy tube tip overlies the tracheal air column at the thoracic inlet. 2. Stable moderate cardiomegaly without overt pulmonary edema. 3. Minimal bibasilar atelectasis. Electronically Signed   By: Ilona Sorrel M.D.   On: 08/05/2020 14:01        Scheduled Meds:  apixaban  5 mg Oral BID   atorvastatin  80 mg Oral q1800   cholecalciferol  2,000 Units Oral Daily   DULoxetine  40 mg Oral Daily   feeding supplement (ENSURE ENLIVE)  237 mL Oral BID BM   gabapentin  200 mg Oral QHS   insulin aspart  0-5 Units Subcutaneous QHS   insulin aspart  0-9 Units Subcutaneous TID WC   losartan  25 mg Oral QHS   magnesium oxide  400 mg Oral BID   metoprolol tartrate  50 mg Oral BID   QUEtiapine  25 mg Oral QHS   senna-docusate  1 tablet Oral QHS   sodium chloride flush  3 mL Intravenous Q12H   Continuous Infusions:  sodium chloride     ciprofloxacin 400 mg (08/07/20 0532)   dextrose 5 % and 0.45% NaCl 75 mL/hr at 08/07/20 0533   diltiazem (CARDIZEM)  infusion 5 mg/hr (08/07/20 1021)     LOS: 5 days     Cordelia Poche, MD [facility] 08/07/2020, 11:49 AM  If 7PM-7AM, please contact night-coverage [email or web address]

## 2020-08-08 DIAGNOSIS — Z789 Other specified health status: Secondary | ICD-10-CM

## 2020-08-08 DIAGNOSIS — Z515 Encounter for palliative care: Secondary | ICD-10-CM

## 2020-08-08 DIAGNOSIS — R5383 Other fatigue: Secondary | ICD-10-CM

## 2020-08-08 LAB — GLUCOSE, CAPILLARY: Glucose-Capillary: 116 mg/dL — ABNORMAL HIGH (ref 70–99)

## 2020-08-08 MED ORDER — FENTANYL CITRATE (PF) 100 MCG/2ML IJ SOLN
12.5000 ug | INTRAMUSCULAR | Status: DC | PRN
  Administered 2020-08-08 – 2020-08-09 (×7): 50 ug via INTRAVENOUS
  Filled 2020-08-08 (×7): qty 2

## 2020-08-08 MED ORDER — POLYVINYL ALCOHOL 1.4 % OP SOLN
1.0000 [drp] | Freq: Four times a day (QID) | OPHTHALMIC | Status: DC | PRN
  Filled 2020-08-08: qty 15

## 2020-08-08 MED ORDER — BIOTENE DRY MOUTH MT LIQD
15.0000 mL | Freq: Two times a day (BID) | OROMUCOSAL | Status: DC
  Administered 2020-08-09: 15 mL via TOPICAL

## 2020-08-08 MED ORDER — SODIUM CHLORIDE 0.9 % IV BOLUS
250.0000 mL | Freq: Once | INTRAVENOUS | Status: AC
  Administered 2020-08-08: 250 mL via INTRAVENOUS

## 2020-08-08 MED ORDER — HALOPERIDOL LACTATE 2 MG/ML PO CONC
2.0000 mg | Freq: Four times a day (QID) | ORAL | Status: DC | PRN
  Filled 2020-08-08: qty 1

## 2020-08-08 MED ORDER — GLYCOPYRROLATE 0.2 MG/ML IJ SOLN: 0.2000 mg | INTRAMUSCULAR | Status: DC | PRN

## 2020-08-08 MED ORDER — HALOPERIDOL LACTATE 5 MG/ML IJ SOLN: 2.0000 mg | Freq: Four times a day (QID) | INTRAMUSCULAR | Status: DC | PRN

## 2020-08-08 MED ORDER — GLYCOPYRROLATE 1 MG PO TABS
1.0000 mg | ORAL_TABLET | ORAL | Status: DC | PRN
  Filled 2020-08-08: qty 1

## 2020-08-08 NOTE — Care Management Important Message (Signed)
Important Message  Patient Details  Name: Edin Skarda MRN: [id number] Date of Birth: 18-Oct-1946   Medicare Important Message Given:  Yes     Shelda Altes 08/08/2020, 11:51 AM

## 2020-08-08 NOTE — Progress Notes (Signed)
Patient's blood pressure 87/57 and HR 80s on Cardizem, stopped infusion per medication order in [facility]. Paged [facility] for orders to continue to stop infusion. Will continue to monitor patient closely.

## 2020-08-08 NOTE — Progress Notes (Signed)
PROGRESS NOTE    Angel Allen  WGY:[id number] DOB: 1946/07/26 DOA: 08/01/2020 PCP: Deanne Coffer, MD   Brief Narrative: Angel Allen is a 73 y.o. 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  Patient presented secondary to recurrent dislodgment of her tracheostomy tube.   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  Patient states that she is not pulling this out but concerned there could be some confusion. ED replaced trach with 4 cuffless with recommendations to switch back to 4 cuffed shiley per pulmonology. There is no witness with regard to patient pulling out her trach however patient is confused.  Patient transferred from any plan to [facility] for pulmonology evaluation. These episodes appear to be secondary to patient pulling her trach out herself in setting of below. Some rhonchi. Chest x-ray stable.  Confusion I discussion with patient's son.  Patient's son states that she has had some intermittent issues with confusion.  He states that this usually happens when she has urinary tract infections.  Vitamin B12 and folate within normal limits.  TSH within normal limits. Likely underlying element of chronic cognitive impairment with  delirium. Urinalysis concerning for infection. Initial urine culture with multiple species. Repeat urine culture (8/6) apparently not received by lab. -Continue Ciprofloxacin x7 days (last day today)  Chronic atrial fibrillation with RVR Patient was started on Cardizem drip on admission.  Patient is on Cardizem 30 mg daily as needed in addition to metoprolol 50 mg twice daily.  Rate is improved on Cardizem drip however patient is not taking p.o. reliably -Discontinue Cardizem drip as her BP is not tolerating.  Essential hypertension Patient is on losartan and metoprolol as an outpatient.  Blood pressure is controlled.  Patient did not take her losartan or metoprolol -Discontinue home losartan and metoprolol secondary to hypotension  Diabetes mellitus, type II Patient is on Lantus 10 units nightly in addition to Humalog sliding scale 2 to 10 units 3 times daily before meals.  Last hemoglobin A1c of 5.9% in July 2021.  Patient with recurrent hypoglycemia in setting of poor oral intake which has improved on D5 IV fluids -Continue sliding scale insulin -D5 IV fluids  Invasive squamous cell carcinoma of the glottis Patient currently not receiving chemotherapy or radiation.  Not a surgical candidate per chart review. -Palliative care recommendations: SNF with hospice  CKD stage IIIb Stable.  S/p AICD Disable 08/08/2020   DVT prophylaxis: Eliquis as able; SCDs Code Status:   Code Status: DNR Family Communication: None on telephone Disposition Plan: Anticipate discharge likely to hospice facility as patient continues to refuse oral intake and treatments are ineffective. Discharge when bed is available.   Consultants:   Pulmonology  Palliative care medicine  Procedures:   None  Antimicrobials:  Ciprofloxacin   Subjective:  Confused.  Objective: Vitals:   08/08/20 0700 08/08/20 0755 08/08/20 0812 08/08/20 0925  BP:  (!) 72/58 (!) 72/47 (!) 85/54  Pulse: 93 69  77  Resp: 18  14  12   Temp:  98.1 F (36.7 C)    TempSrc:  Axillary    SpO2: 100% 100%  100%  Weight:      Height:        Intake/Output Summary (Last 24 hours) at 08/08/2020 1328 Last data filed at 08/07/2020 2000 Gross per 24 hour  Intake 100 ml  Output --  Net 100 ml   Filed Weights   08/06/20 0508 08/07/20 0442 08/08/20 0339  Weight: 89.4 kg 93.9 kg 92 kg    Examination:  General exam: Appears calm and comfortable Respiratory system: Diminished with rhonchi bilaterally. Respiratory effort normal. Cardiovascular system: S1 & S2 heard, RRR. Gastrointestinal system: Abdomen is nondistended, soft and nontender. Normal bowel sounds heard. Central nervous system: Alert and not oriented. Musculoskeletal: No calf tenderness Skin: No cyanosis. No rashes Psychiatry: Judgement and insight appear mipaired.    Data Reviewed: I have personally reviewed following labs and imaging studies  CBC Lab Results  Component Value Date   WBC 7.3 08/05/2020   RBC 3.64 (L) 08/05/2020   HGB 9.9 (L) 08/05/2020   HCT 31.3 (L) 08/05/2020   MCV 86.0 08/05/2020   MCH 27.2 08/05/2020   PLT 279 08/05/2020   MCHC 31.6 08/05/2020   RDW 17.5 (H) 08/05/2020   LYMPHSABS 2.2 08/01/2020   MONOABS 1.2 (H) 08/01/2020   EOSABS 0.2 08/01/2020   BASOSABS 0.0 76/72/0947     Last metabolic panel Lab Results  Component Value Date   NA 133 (L) 08/06/2020   K 3.6 08/06/2020   CL 102 08/06/2020   CO2 18 (L) 08/06/2020   BUN 6 (L) 08/06/2020   CREATININE 1.41 (H) 08/06/2020   GLUCOSE 178 (H) 08/06/2020   GFRNONAA 36 (L) 08/06/2020   GFRAA 42 (L) 08/06/2020   CALCIUM 8.5 (L) 08/06/2020   PROT 5.4 (L) 07/07/2020   ALBUMIN 2.2 (L) 07/07/2020   BILITOT 0.7 07/07/2020   ALKPHOS 73 07/07/2020   AST 17 07/07/2020   ALT 12 07/07/2020   ANIONGAP 13 08/06/2020    CBG (last 3)  Recent Labs    08/07/20 1651 08/07/20 2126 08/08/20 0754  GLUCAP 115* 112* 116*     GFR: Estimated Creatinine Clearance: 38 mL/min  (A) (by C-G formula based on SCr of 1.41 mg/dL (H)).  Coagulation Profile: No results for input(s): INR, PROTIME in the last 168 hours.  Recent Results (from the past 240 hour(s))  SARS Coronavirus 2 by RT PCR (hospital order, performed in [facility] hospital lab) Nasopharyngeal Nasopharyngeal Swab     Status: None   Collection Time: 08/01/20  6:21 AM   Specimen: Nasopharyngeal Swab  Result Value Ref Range Status   SARS Coronavirus 2 NEGATIVE NEGATIVE Final    Comment: (NOTE) SARS-CoV-2 target nucleic acids are NOT DETECTED.  The SARS-CoV-2 RNA is generally detectable in upper and lower respiratory specimens during the acute phase of infection. The lowest concentration of SARS-CoV-2 viral copies this assay can detect is 250 copies / mL. A negative result does not preclude SARS-CoV-2 infection and should not be used as the sole basis for treatment or other patient management decisions.  A negative result may occur with improper specimen collection / handling, submission of specimen other than nasopharyngeal swab, presence of viral mutation(s) within the areas targeted by this  assay, and inadequate number of viral copies (<250 copies / mL). A negative result must be combined with clinical observations, patient history, and epidemiological information.  Fact Sheet for Patients:   StrictlyIdeas.no  Fact Sheet for Healthcare Providers: BankingDealers.co.za  This test is not yet approved or  cleared by the [place] FDA and has been authorized for detection and/or diagnosis of SARS-CoV-2 by FDA under an Emergency Use Authorization (EUA).  This EUA will remain in effect (meaning this test can be used) for the duration of the COVID-19 declaration under Section 564(b)(1) of the Act, 21 U.S.C. section 360bbb-3(b)(1), unless the authorization is terminated or revoked sooner.  Performed at [facility], [address] [place], [place]  [address]   Culture, Urine     Status: Abnormal   Collection Time: 08/02/20 11:59 AM   Specimen: Urine, Catheterized  Result Value Ref Range Status   Specimen Description URINE, CATHETERIZED  Final   Special Requests   Final    NONE Performed at [facility], [place] [address] [place], [place] [address]    Culture MULTIPLE SPECIES PRESENT, SUGGEST RECOLLECTION (A)  Final   Report Status 08/03/2020 FINAL  Final        Radiology Studies: No results found.      Scheduled Meds: . apixaban  5 mg Oral BID  . atorvastatin  80 mg Oral q1800  . cholecalciferol  2,000 Units Oral Daily  . DULoxetine  40 mg Oral Daily  . feeding supplement (ENSURE ENLIVE)  237 mL Oral BID BM  . gabapentin  200 mg Oral QHS  . insulin aspart  0-5 Units Subcutaneous QHS  . insulin aspart  0-9 Units Subcutaneous TID WC  . magnesium oxide  400 mg Oral BID  . QUEtiapine  25 mg Oral QHS  . senna-docusate  1 tablet Oral QHS  . sodium chloride flush  3 mL Intravenous Q12H   Continuous Infusions: . sodium chloride    . ciprofloxacin 400 mg (08/08/20 0532)  . dextrose 5 % and 0.45% NaCl 75 mL/hr at 08/08/20 0531     LOS: 6 days     Cordelia Poche, MD [facility] 08/08/2020, 1:28 PM  If 7PM-7AM, please contact night-coverage [email or web address]

## 2020-08-08 NOTE — TOC Progression Note (Signed)
Transition of Care [facility]) - Progression Note    Patient Details  Name: Angel Allen MRN: [id number] Date of Birth: Dec 21, 1946  Transition of Care [facility]) CM/SW [place], [place] Phone Number: 08/08/2020, 3:51 PM  Clinical Narrative:     Patients son requested for CSW to reach out to [facility] for bed availability. CSW called [facility] who will follow up with son. [facility] confirmed no bed availability today.  CSW will continue to follow.       Expected Discharge Plan and Services                                                 Social Determinants of Health (SDOH) Interventions    Readmission Risk Interventions Readmission Risk Prevention Plan 07/07/2020  Transportation Screening Complete  PCP or Specialist Appt within 3-5 Days Complete  HRI or [place] Complete  Social Work Consult for [place] Planning/Counseling Complete  Palliative Care Screening Not Applicable  Medication Review Press photographer) Complete  Some recent data might be hidden

## 2020-08-08 NOTE — Progress Notes (Signed)
Daily Progress Note   Patient Name: Angel Allen       Date: 08/08/2020 DOB: 1946/08/28  Age: 74 y.o. MRN#: [id number] Attending Physician: Mariel Aloe, MD Primary Care Physician: Deanne Coffer, MD Admit Date: 08/01/2020   Reason for Consultation/Follow-up: Disposition, Establishing goals of care, Non pain symptom management, Pain control, Psychosocial/spiritual support and Terminal Care  Subjective: Chart review performed - noted patient had been transitioned to full comfort care and is now waiting for a bed at [facility]. Received report from RN. RN had no acute concerns outside patient's low BP. RN states ICD was disabled today 08/08/20.  Went to visit patient at bedside - son/Bruce was present. Patient was lying in bed asleep with no signs or non-verbal gestures of pain or discomfort. No respiratory distress, increased work of breathing, or secretions noted.   Son stated that the patient has not eaten much the last two days but did eat two sandwiches, other snack items, drank two Mt. Dews, and took her medications today. Encouraged him to continue to provide food/drink to patient if/when she is interested. Expectations at end of life were discussed - son expressed understanding. Asked Bruce, knowing our efforts are focused on the patient's comfort at this time, if he would want [place] to continue to monitor her CBGs and providing IVF. Bruce stated he was ok to stop these interventions.   Update was provided that we were still waiting for a bed at [facility]. Bruce asked what the BP visitation policy was - called [facility] liaison for current policy and provided that information to Bruce - I appreciate Melissa's responsiveness. Explained the current [facility] was not aware  that other visitors were allowed. He was appreciative of this information.   Addressed all questions and concerns. Encouraged to call if there were any other questions/concerns. PMT card was provided.  Length of Stay: 6  Current Medications: Scheduled Meds:  . antiseptic oral rinse  15 mL Topical BID  . DULoxetine  40 mg Oral Daily  . gabapentin  200 mg Oral QHS  . QUEtiapine  25 mg Oral QHS  . senna-docusate  1 tablet Oral QHS  . sodium chloride flush  3 mL Intravenous Q12H    Continuous Infusions: . sodium chloride      PRN  Meds: sodium chloride, acetaminophen **OR** acetaminophen, albuterol, fentaNYL (SUBLIMAZE) injection, glycopyrrolate **OR** glycopyrrolate **OR** glycopyrrolate, haloperidol **OR** haloperidol lactate, nitroGLYCERIN, ondansetron **OR** ondansetron (ZOFRAN) IV, polyethylene glycol, polyvinyl alcohol, sodium chloride flush, traZODone  Physical Exam Vitals and nursing note reviewed.  Constitutional:      General: She is not in acute distress.    Appearance: She is ill-appearing.  Pulmonary:     Effort: No respiratory distress.     Comments: Tracheostomy in place Skin:    General: Skin is warm and dry.  Neurological:     Mental Status: She is lethargic.     Motor: Weakness present.             Vital Signs: BP (!) 70/42 (BP Location: Left Arm)   Pulse (!) 106   Temp 98.1 F (36.7 C) (Axillary)   Resp 13   Ht 5\' 5"  (1.651 m)   Wt 92 kg   SpO2 99%   BMI 33.75 kg/m  SpO2: SpO2: 99 % O2 Device: O2 Device: Tracheostomy Collar O2 Flow Rate: O2 Flow Rate (L/min): 5 L/min  Intake/output summary:   Intake/Output Summary (Last 24 hours) at 08/08/2020 1452 Last data filed at 08/08/2020 1100 Gross per 24 hour  Intake 600 ml  Output --  Net 600 ml   LBM: Last BM Date: 08/07/20 Baseline Weight: Weight: 41 kg Most recent weight: Weight: 92 kg       Palliative Assessment/Data: PPS 20-30%    Flowsheet Rows     Most Recent Value  Intake Tab   Referral Department Hospitalist  Unit at Time of Referral Intermediate Care Unit  Palliative Care Primary Diagnosis Cancer  Date Notified 08/02/20  Palliative Care Type New Palliative care  Reason for referral Clarify Goals of Care  Date of Admission 08/01/20  Date first seen by Palliative Care 08/03/20  # of days Palliative referral response time 1 Day(s)  # of days IP prior to Palliative referral 1  Clinical Assessment  Psychosocial & Spiritual Assessment  Palliative Care Outcomes      Patient Active Problem List   Diagnosis Date Noted  . Tracheostomy malfunction ([place])   . Goals of care, counseling/discussion   . Palliative care by specialist   . 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 Tracheostomy dependence ([place])   . Chronic atrial fibrillation with rapid ventricular response ([place]) 07/06/2020  . GERD (gastroesophageal reflux disease)   . Hypertension   . Type II diabetes mellitus ([place])   . Acute on chronic respiratory failure with hypoxia ([place])   . Cancer of glottis ([place])   . Systolic congestive heart failure with reduced left ventricular function, NYHA class 3 ([employer])   . Chronic atrial fibrillation ([place])   . Chronic kidney disease, stage III (moderate)   . Cerebrovascular disease 2009  . Tracheostomy in place [facility]) 2009  . DNR (do not resuscitate) 2009  . DNI (do not intubate) 2009  . Leukocytosis 2009    Palliative Care Assessment & Plan   Patient Profile: 74 y.o. 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 PMT consulted for [place].  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 CKD stage III Chronic atrail fibrillation with rapid ventricular response Tracheostomy in place DNR/DNI Tracheostomy  malfunction Comfort measures Hypoglycemia Confusion Diabetes mellitus, type II ICD  Recommendations/Plan:  Patient waiting for a bed at [place] full comfort measures  Continue DNR/ DNI as previously documented  Added orders to ensure EOL comfort and to reflect full comfort measures, as well as discontinued orders that were not focused on comfort  Unrestricted visitation orders were placed and Bruce was updated on current [place] EOL visitation policy   PMT will continue to follow peripherally. If there are any imminent needs please call the service directly  Goals of Care and Additional Recommendations:  Limitations on Scope of Treatment: Full Comfort Care  Code Status:    Code Status Orders  (From admission, onward)         Start     Ordered   08/08/20 1451  Do not attempt resuscitation (DNR)  Continuous       Question Answer Comment  In the event of cardiac or respiratory ARREST Do not call a "code blue"   In the event of cardiac or respiratory ARREST Do not perform Intubation, CPR, defibrillation or ACLS   In the event of cardiac or respiratory ARREST Use medication by any route, position, wound care, and other measures to relive pain and suffering. May use oxygen, suction and manual treatment of airway obstruction as needed for comfort.      08/08/20 1450        Code Status History    Date Active Date Inactive Code Status Order ID Comments User Context   08/01/2020 1722 08/08/2020 1451 DNR [id number]  Roxan Hockey, MD ED   07/06/2020 1938 07/07/2020 2042 DNR [id number]  Murlean Iba, MD ED   12/21/2014 1310 12/22/2014 1514 Full Code [id number]  Leonie Man, MD Inpatient   12/19/2014 1451 12/21/2014 1310 Full Code [id number]  Almyra Deforest, PA Inpatient   Advance Care Planning Activity       Prognosis:   Hours - Days  Discharge Planning:  Hospice facility waiting for bed at [facility], could become hospital death  Care  plan was discussed with Bruce/son, primary RN, [facility] liaison, Dr. Lonny Prude  Thank you for allowing the Palliative Medicine Team to assist in the care of this patient.   Total Time 35 minutes Prolonged Time Billed  no       Greater than 50%  of this time was spent counseling and coordinating care related to the above assessment and plan.  Lin Landsman, NP  Please contact Palliative Medicine Team phone at [phone] for questions and concerns.

## 2020-08-08 NOTE — Progress Notes (Signed)
[employer] Representative to bedside; States he turned the  ICD off. Leveda Anna, BSN, RN

## 2020-08-08 NOTE — Progress Notes (Signed)
Pt BP 85/45. Hr 85. Pt responds to voice. Lonny Prude, MD made aware and stated he would contact pt's son on patient's status and plan of care. Will continue to monitor.

## 2020-08-08 NOTE — Progress Notes (Signed)
RN contacted [employer] for ICD to be turned off. Spoke with [employer] Wellsite geologist, [place]. Stated representative will be here shortly.

## 2020-08-08 NOTE — Progress Notes (Signed)
RN called pt's son Darnell Level and updated him on plan of care. Son understands that pt is now comfort care and will come to see pt as soon as he can.

## 2020-08-08 NOTE — Progress Notes (Signed)
Engineer, maintenance [facility]) Hospital Liaison note.   Received request from [place] for family interest in [facility]. [facility] is unable to offer a room today.  Hospital Liaison will follow up tomorrow or sooner if a room becomes available.   A Please do not hesitate to call with questions.   Thank you,  Clementeen Hoof, RN, [facility] ([facility] [place]) [phone]

## 2020-08-09 MED ORDER — GLYCOPYRROLATE 1 MG PO TABS: 1.0000 mg | ORAL_TABLET | ORAL | Status: AC | PRN

## 2020-08-09 MED ORDER — QUETIAPINE FUMARATE 25 MG PO TABS: 25.0000 mg | ORAL_TABLET | Freq: Every day | ORAL | Status: AC

## 2020-08-09 NOTE — Discharge Summary (Signed)
Physician Discharge Summary  Angel Allen OVZ:[id number] DOB: 1946/02/28 DOA: 08/01/2020  PCP: Deanne Coffer, MD  Admit date: 08/01/2020 Discharge date: 08/09/2020  Admitted From: Home.  Disposition:   [facility]  Recommendations for Outpatient Follow-up:  1. Please follow up with Hospice MD as recommended.     Discharge Condition:hospice.  CODE STATUS:comfort care.  Diet recommendation: comfort feeds.   Brief/Interim Summary: Angel Allen is a 74 y.o. 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  Patient presented secondary to recurrent dislodgment of her tracheostomy tube.  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  Patient states that she is not pulling this out but concerned there could be some confusion. ED replaced trach with 4 cuffless with recommendations to switch back to 4 cuffed shiley per pulmonology. There is no witness with regard to patient pulling out her trach however patient is confused.   Confusion I discussion with patient's son.  Patient's son states that she has had some intermittent issues with confusion.  He states that this usually happens when she has urinary tract infections.   Vitamin B12 and folate within normal limits.  TSH within normal limits. Likely underlying element of chronic cognitive impairment with delirium. Urinalysis concerning for infection. Initial urine culture with multiple species. Repeat urine culture (8/6) apparently not received by lab. -Completed ciprofloxacin.   Chronic atrial fibrillation with RVR Rate controlled.   Essential hypertension Patient is on losartan and metoprolol as an outpatient.  Blood pressure is controlled.  Patient did not take her losartan or metoprolol -Discontinue home losartan and metoprolol secondary to hypotension  Diabetes mellitus, type II Pt on comfort measures.   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  Not a surgical candidate per chart review. -Palliative care recommendations: [facility] with comfort measures.   CKD stage IIIb Stable.  S/p AICD Disable 08/08/2020   Discharge Instructions  Discharge Instructions    Discharge instructions   Complete by: As directed    Please follow up with Hospice MD as recommended.     Allergies as of 08/09/2020      Reactions   Amoxicillin Hives   Morphine And Related    AMS, per son, she pulled out of her IV after morphine, however seems to be able to tolerate hydrocodone-acetaminophen   Penicillins Hives      Medication List    STOP taking these medications   apixaban 5 MG Tabs tablet Commonly known as: ELIQUIS   atorvastatin 80 MG tablet Commonly known as: LIPITOR   Calmoseptine 0.44-20.6 % Oint Generic drug: Menthol-Zinc Oxide   diltiazem 30 MG tablet Commonly known as: CARDIZEM   furosemide 40 MG tablet Commonly known as: LASIX   gabapentin 100 MG capsule  Commonly known as: NEURONTIN   insulin glargine 100 UNIT/ML injection Commonly known as: LANTUS   insulin lispro 100 UNIT/ML injection Commonly known as: HUMALOG   losartan 50 MG tablet Commonly known as:  COZAAR   magnesium oxide 400 MG tablet Commonly known as: MAG-OX   metoprolol tartrate 50 MG tablet Commonly known as: LOPRESSOR   PROSTAT PO   Vitamin D 50 MCG (2000 UT) tablet     TAKE these medications   DULoxetine 20 MG capsule Commonly known as: CYMBALTA Take 40 mg by mouth daily.   glycopyrrolate 1 MG tablet Commonly known as: ROBINUL Take 1 tablet (1 mg total) by mouth every 4 (four) hours as needed (excessive secretions).   nitroGLYCERIN 0.4 MG SL tablet Commonly known as: NITROSTAT Place 1 tablet (0.4 mg total) under the tongue every 5 (five) minutes as needed for chest pain.   QUEtiapine 25 MG tablet Commonly known as: SEROQUEL Take 1 tablet (25 mg total) by mouth at bedtime.   sennosides-docusate sodium 8.6-50 MG tablet Commonly known as: SENOKOT-S Take 1 tablet by mouth at bedtime.       Follow-up Information    Deanne Coffer, MD Follow up.   Specialty: Internal Medicine Contact information: [address]   [place] [id number] [phone]              Allergies  Allergen Reactions  . Amoxicillin Hives  . Morphine And Related     AMS, per son, she pulled out of her IV after morphine, however seems to be able to tolerate hydrocodone-acetaminophen  . Penicillins Hives    Consultations: Palliative care   Procedures/Studies: CT HEAD WO CONTRAST  Result Date: 08/02/2020 CLINICAL DATA:  Mental status change EXAM: CT HEAD WITHOUT CONTRAST TECHNIQUE: Contiguous axial images were obtained from the base of the skull through the vertex without intravenous contrast. COMPARISON:  None. FINDINGS: Brain: Moderate atrophy. Extensive chronic microvascular ischemic change throughout the white matter and basal ganglia Negative for acute infarct, hemorrhage, mass. Vascular: Negative for hyperdense vessel Skull: Negative Sinuses/Orbits: Mild mucosal edema maxillary sinus bilaterally. Negative orbit. Other: None IMPRESSION: No acute abnormality.  Moderate atrophy and extensive chronic microvascular ischemic change. Electronically Signed   By: Franchot Gallo M.D.   On: 08/02/2020 11:09   DG CHEST PORT 1 VIEW  Result Date: 08/05/2020 CLINICAL DATA:  Dislodged tracheostomy EXAM: PORTABLE CHEST 1 VIEW COMPARISON:  08/02/2020 chest radiograph. FINDINGS: Tracheostomy tube tip overlies the tracheal air column at the thoracic inlet. Stable single lead left subclavian ICD. Stable cardiomediastinal silhouette with moderate cardiomegaly. No pneumothorax. No pleural effusion. No overt pulmonary edema. Mild elevation of the left hemidiaphragm. Minimal bibasilar atelectasis. IMPRESSION: 1. Tracheostomy tube tip overlies the tracheal air column at the thoracic inlet. 2. Stable moderate cardiomegaly without overt pulmonary edema. 3. Minimal bibasilar atelectasis. Electronically Signed   By: Ilona Sorrel M.D.   On: 08/05/2020 14:01   DG CHEST PORT 1 VIEW  Result Date: 08/02/2020 CLINICAL DATA:  74 year old female with tracheostomy malfunction. Tracheostomy replaced. EXAM: PORTABLE CHEST 1 VIEW COMPARISON:  Portable chest 08/01/2020 and earlier. FINDINGS: Portable AP semi upright view at 0733 hours. Unchanged tracheostomy tube position, no adverse features. Stable lung volumes and mediastinal contours. Stable left chest AICD. Visualized tracheal air column is within normal limits. Allowing for portable technique the lungs are clear. Mildly increased bowel gas in the visible upper abdomen. No acute osseous abnormality identified. IMPRESSION: 1. Tracheostomy tube in place with no adverse features. 2.  No acute cardiopulmonary abnormality. Electronically Signed   By: Genevie Ann M.D.   On: 08/02/2020 07:58   DG Chest Portable 1 View  Result Date: 08/01/2020 CLINICAL DATA:  Dislodged tracheostomy. EXAM: PORTABLE CHEST 1 VIEW COMPARISON:  07/06/2020 FINDINGS: Cardiac pacemaker. Tracheostomy tube appears in place. Cardiac enlargement. Lungs are clear. No pleural effusions. No  pneumothorax. IMPRESSION: Cardiac enlargement. No evidence of active pulmonary disease. Tracheostomy tube appears in satisfactory position. Electronically Signed   By: Lucienne Capers M.D.   On: 08/01/2020 06:41       Subjective: No complaints.   Discharge Exam: Vitals:   08/09/20 0442 08/09/20 0735  BP: 125/88   Pulse: (!) 108 93  Resp: 12 17  Temp: 99.1 F (37.3 C)   SpO2: (!) 0%    Vitals:   08/08/20 1337 08/08/20 2036 08/09/20 0442 08/09/20 0735  BP: (!) 70/42  125/88   Pulse: (!) 106 (!) 119 (!) 108 93  Resp: 13  12 17   Temp:   99.1 F (37.3 C)   TempSrc:   Axillary   SpO2: 99% 100% (!) 0%   Weight:      Height:        General: Pt is alert, awake, not in acute distress Cardiovascular: irregular.  Respiratory: CTA bilaterally,  Abdominal: Soft, NT, ND, bowel sounds + Extremities: no edema, no cyanosis    The results of significant diagnostics from this hospitalization (including imaging, microbiology, ancillary and laboratory) are listed below for reference.     Microbiology: Recent Results (from the past 240 hour(s))  SARS Coronavirus 2 by RT PCR (hospital order, performed in [facility] hospital lab) Nasopharyngeal Nasopharyngeal Swab     Status: None   Collection Time: 08/01/20  6:21 AM   Specimen: Nasopharyngeal Swab  Result Value Ref Range Status   SARS Coronavirus 2 NEGATIVE NEGATIVE Final    Comment: (NOTE) SARS-CoV-2 target nucleic acids are NOT DETECTED.  The SARS-CoV-2 RNA is generally detectable in upper and lower respiratory specimens during the acute phase of infection. The lowest concentration of SARS-CoV-2 viral copies this assay can detect is 250 copies / mL. A negative result does not preclude SARS-CoV-2 infection and should not be used as the sole basis for treatment or other patient management decisions.  A negative result may occur with improper specimen collection / handling, submission of specimen other than nasopharyngeal swab,  presence of viral mutation(s) within the areas targeted by this assay, and inadequate number of viral copies (<250 copies / mL). A negative result must be combined with clinical observations, patient history, and epidemiological information.  Fact Sheet for Patients:   StrictlyIdeas.no  Fact Sheet for Healthcare Providers: BankingDealers.co.za  This test is not yet approved or  cleared by the [place] FDA and has been authorized for detection and/or diagnosis of SARS-CoV-2 by FDA under an Emergency Use Authorization (EUA).  This EUA will remain in effect (meaning this test can be used) for the duration of the COVID-19 declaration under Section 564(b)(1) of the Act, 21 U.S.C. section 360bbb-3(b)(1), unless the authorization is terminated or revoked sooner.  Performed at [facility], [address] [place], [place] [address]   Culture, Urine     Status: Abnormal   Collection Time: 08/02/20 11:59 AM   Specimen: Urine, Catheterized  Result Value Ref Range Status   Specimen Description URINE, CATHETERIZED  Final   Special Requests   Final    NONE Performed at [facility], [place]  [address] [place], [place] [address]    Culture MULTIPLE SPECIES PRESENT, SUGGEST RECOLLECTION (A)  Final   Report Status 08/03/2020 FINAL  Final     Labs: BNP (last 3 results) No results for input(s): BNP in the last 8760 hours. Basic Metabolic Panel: Recent Labs  Lab 08/03/20 0426 08/04/20 0537 08/05/20 0046 08/06/20 0654  NA 141 137 134* 133*  K 4.1 3.3* 3.2* 3.6  CL 107 104 103 102  CO2 25 24 21* 18*  GLUCOSE 69* 130* 139* 178*  BUN 13 9 8  6*  CREATININE 1.52* 1.43* 1.34* 1.41*  CALCIUM 8.9 8.6* 8.4* 8.5*  MG 1.7  --  1.2* 1.4*   Liver Function Tests: No results for input(s): AST, ALT, ALKPHOS, BILITOT, PROT, ALBUMIN in the last 168 hours. No results for input(s): LIPASE, AMYLASE in the last 168 hours. No results for input(s):  AMMONIA in the last 168 hours. CBC: Recent Labs  Lab 08/05/20 0046  WBC 7.3  HGB 9.9*  HCT 31.3*  MCV 86.0  PLT 279   Cardiac Enzymes: No results for input(s): CKTOTAL, CKMB, CKMBINDEX, TROPONINI in the last 168 hours. BNP: Invalid input(s): POCBNP CBG: Recent Labs  Lab 08/07/20 0817 08/07/20 1204 08/07/20 1651 08/07/20 2126 08/08/20 0754  GLUCAP 102* 132* 115* 112* 116*   D-Dimer No results for input(s): DDIMER in the last 72 hours. Hgb A1c No results for input(s): HGBA1C in the last 72 hours. Lipid Profile No results for input(s): CHOL, HDL, LDLCALC, TRIG, CHOLHDL, LDLDIRECT in the last 72 hours. Thyroid function studies No results for input(s): TSH, T4TOTAL, T3FREE, THYROIDAB in the last 72 hours.  Invalid input(s): FREET3 Anemia work up No results for input(s): VITAMINB12, FOLATE, FERRITIN, TIBC, IRON, RETICCTPCT in the last 72 hours. Urinalysis    Component Value Date/Time   COLORURINE YELLOW 08/02/2020 1220   APPEARANCEUR HAZY (A) 08/02/2020 1220   LABSPEC 1.012 08/02/2020 1220   PHURINE 5.0 08/02/2020 1220   GLUCOSEU NEGATIVE 08/02/2020 1220   HGBUR LARGE (A) 08/02/2020 1220   BILIRUBINUR NEGATIVE 08/02/2020 1220   KETONESUR NEGATIVE 08/02/2020 1220   PROTEINUR 30 (A) 08/02/2020 1220   NITRITE POSITIVE (A) 08/02/2020 1220   LEUKOCYTESUR MODERATE (A) 08/02/2020 1220   Sepsis Labs Invalid input(s): PROCALCITONIN,  WBC,  LACTICIDVEN Microbiology Recent Results (from the past 240 hour(s))  SARS Coronavirus 2 by RT PCR (hospital order, performed in [place] hospital lab) Nasopharyngeal Nasopharyngeal Swab     Status: None   Collection Time: 08/01/20  6:21 AM   Specimen: Nasopharyngeal Swab  Result Value Ref Range Status   SARS Coronavirus 2 NEGATIVE NEGATIVE Final    Comment: (NOTE) SARS-CoV-2 target nucleic acids are NOT DETECTED.  The SARS-CoV-2 RNA is generally detectable in upper and lower respiratory specimens during the acute phase of  infection. The lowest concentration of SARS-CoV-2 viral copies this assay can detect is 250 copies / mL. A negative result does not preclude SARS-CoV-2 infection and should not be used as the sole basis for treatment or other patient management decisions.  A negative result may occur with improper specimen collection / handling, submission of specimen other than nasopharyngeal swab, presence of viral mutation(s) within the areas targeted by this assay, and inadequate number of viral copies (<250 copies / mL). A negative result must be combined with clinical observations, patient history, and epidemiological information.  Fact Sheet for Patients:   StrictlyIdeas.no  Fact Sheet for Healthcare Providers: BankingDealers.co.za  This test is not yet approved or  cleared  by the [place] and has been authorized for detection and/or diagnosis of SARS-CoV-2 by FDA under an Emergency Use Authorization (EUA).  This EUA will remain in effect (meaning this test can be used) for the duration of the COVID-19 declaration under Section 564(b)(1) of the Act, 21 U.S.C. section 360bbb-3(b)(1), unless the authorization is terminated or revoked sooner.  Performed at [facility], [address] [place], [place] [address]   Culture, Urine     Status: Abnormal   Collection Time: 08/02/20 11:59 AM   Specimen: Urine, Catheterized  Result Value Ref Range Status   Specimen Description URINE, CATHETERIZED  Final   Special Requests   Final    NONE Performed at [facility], [place] [address] [place], [place] [address]    Culture MULTIPLE SPECIES PRESENT, SUGGEST RECOLLECTION (A)  Final   Report Status 08/03/2020 FINAL  Final     Time coordinating discharge: 31 minutes  SIGNED:   Hosie Poisson, MD  [facility]

## 2020-08-09 NOTE — TOC Transition Note (Signed)
Transition of Care [facility]) - CM/SW Discharge Note   Patient Details  Name: Laci Frenkel MRN: [id number] Date of Birth: August 15, 1946  Transition [facility] Care [facility]) CM/SW Contact:  Trula Ore, [place] Phone Number: 08/09/2020, 12:05 PM   Clinical Narrative:     Patient will DC to: [facility]  Anticipated DC date: 08/09/2020  Family notified: Bruce  Transport by: Corey Harold  ?  Per MD patient ready for DC to [facility] . RN, patient, patient's family, and facility notified of DC.RN given number for report tele#[phone]. DC packet on chart. Ambulance transport requested for patient.  CSW signing off.   Final next level of care: Cross Barriers to Discharge: No Barriers Identified   Patient Goals and CMS Choice Patient states their goals for this hospitalization and ongoing recovery are:: to go to [facility]   Choice offered to / list presented to : Adult Children Darnell Level)  Discharge Placement              Patient chooses bed at:  [facility]) Patient to be transferred to facility by: [place] Name of family member notified: Bruce Patient and family notified of of transfer: 08/09/20  Discharge Plan and Services                                     Social Determinants of Health (SDOH) Interventions     Readmission Risk Interventions Readmission Risk Prevention Plan 07/07/2020  Transportation Screening Complete  PCP or Specialist Appt within 3-5 Days Complete  HRI or [place] Complete  Social Work Consult for [place] Planning/Counseling Complete  Palliative Care Screening Not Applicable  Medication Review Press photographer) Complete  Some recent data might be hidden

## 2020-08-09 NOTE — Progress Notes (Signed)
Engineer, maintenance [facility]) Hospital Liaison note.   Received request from [place] for family interest in [facility] with request for transfer today. Chart reviewed and eligibility confirmed.   Spoke to family Bruce to confirm interest and explain services. Family agreeable to transfer today. CSW aware.  Consents will be completed by [facility] social worker. Dr. Orpah Melter to assume care per family request.    RN please call report to [phone]. Please arrange transport for patient once this [facility] liaison confirms consents complete.  Thank you,   Clementeen Hoof, RN, BSN  [place] (listed on [place] under Hospice and [place] [facility] [place])   [phone]

## 2020-08-09 NOTE — Progress Notes (Signed)
[place] @ [phone] and report given to the receiving Nurse Greggory Stallion, RN. Questions answered. Requested not to remove current IV Site. Son Darnell Level at the bedside; made aware of Discharge. Awaiting PTAR to transferpatient ou. Leveda Anna, BSN, RNt

## 2020-08-09 NOTE — Progress Notes (Signed)
Daily Progress Note   Patient Name: Angel Allen       Date: 08/09/2020 DOB: September 14, 1946  Age: 74 y.o. MRN#: [id number] Attending Physician: Hosie Poisson, MD Primary Care Physician: Deanne Coffer, MD Admit Date: 08/01/2020   Reason for Consultation/Follow-up: Disposition, Establishing goals of care, Non pain symptom management, Pain control, Psychosocial/spiritual support and Terminal Care  Subjective: Chart review performed. Patient still waiting for a bed at [facility]. Received report from RN - had no acute concerns.    Went to visit patient at bedside - son/Bruce was present. Patient was lying in bed asleep with no signs or non-verbal gestures of pain or discomfort. No respiratory distress, increased work of breathing, or secretions noted.   Bruce was aware that we were still waiting for a bed at [facility]. He stated he informed LCSW that he was ok with trying to get a bed at another hospice facility around [facility] if a bed was available there. RN was in room and stated that LCSW was reaching out to other facilities and were aware of his request.   Addressed all questions and concerns. Encouraged to call if there were any other questions/concerns. PMT card was provided.  Length of Stay: 7  Current Medications: Scheduled Meds:  . antiseptic oral rinse  15 mL Topical BID  . DULoxetine  40 mg Oral Daily  . gabapentin  200 mg Oral QHS  . QUEtiapine  25 mg Oral QHS  . senna-docusate  1 tablet Oral QHS  . sodium chloride flush  3 mL Intravenous Q12H    Continuous Infusions: . sodium chloride      PRN Meds: sodium chloride, acetaminophen **OR** acetaminophen, albuterol, fentaNYL (SUBLIMAZE) injection, glycopyrrolate **OR** glycopyrrolate **OR** glycopyrrolate, haloperidol **OR**  haloperidol lactate, nitroGLYCERIN, ondansetron **OR** ondansetron (ZOFRAN) IV, polyethylene glycol, polyvinyl alcohol, sodium chloride flush, traZODone  Physical Exam Vitals and nursing note reviewed.  Constitutional:      General: She is not in acute distress.    Appearance: She is ill-appearing.  Pulmonary:     Effort: No respiratory distress.     Comments: Tracheostomy in place Skin:    General: Skin is warm and dry.  Neurological:     Mental Status: She is lethargic.     Motor: Weakness present.  Vital Signs: BP (!) 77/62 (BP Location: Left Wrist)   Pulse 98   Temp 97.9 F (36.6 C) (Oral)   Resp 17   Ht 5\' 5"  (1.651 m)   Wt 92 kg   SpO2 100%   BMI 33.75 kg/m  SpO2: SpO2: 100 % O2 Device: O2 Device: Tracheostomy Collar O2 Flow Rate: O2 Flow Rate (L/min): 5 L/min  Intake/output summary:   Intake/Output Summary (Last 24 hours) at 08/09/2020 1150 Last data filed at 08/08/2020 1800 Gross per 24 hour  Intake --  Output 1 ml  Net -1 ml   LBM: Last BM Date: 08/07/20 Baseline Weight: Weight: 41 kg Most recent weight: Weight: 92 kg       Palliative Assessment/Data: PPS 20%    Flowsheet Rows     Most Recent Value  Intake Tab  Referral Department Hospitalist  Unit at Time of Referral Intermediate Care Unit  Palliative Care Primary Diagnosis Cancer  Date Notified 08/02/20  Palliative Care Type New Palliative care  Reason for referral Clarify Goals of Care  Date of Admission 08/01/20  Date first seen by Palliative Care 08/03/20  # of days Palliative referral response time 1 Day(s)  # of days IP prior to Palliative referral 1  Clinical Assessment  Psychosocial & Spiritual Assessment  Palliative Care Outcomes      Patient Active Problem List   Diagnosis Date Noted  . Lethargy   . Terminal care   . Tracheostomy malfunction ([place])   . Goals of care, counseling/discussion   . Palliative care by specialist   . 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  Tracheostomy dependence ([place])   . Chronic atrial fibrillation with rapid ventricular response ([place]) 07/06/2020  . GERD (gastroesophageal reflux disease)   . Hypertension   . Type II diabetes mellitus ([place])   . Acute on chronic respiratory failure with hypoxia ([place])   . Cancer of glottis ([place])   . Systolic congestive heart failure with reduced left ventricular function, NYHA class 3 ([place])   . Chronic atrial fibrillation ([place])   . Chronic kidney disease, stage III (moderate)   . Cerebrovascular disease 2009  . Tracheostomy in place [facility]) 2009  . DNR (do not resuscitate) 2009  . DNI (do not intubate) 2009  . Leukocytosis 2009    Palliative Care Assessment & Plan   Patient Profile: 74 y.o. 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 PMT consulted for [place].  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 CKD stage III Chronic atrail fibrillation with rapid ventricular response Tracheostomy in place DNR/DNI Tracheostomy malfunction Comfort measures Hypoglycemia Confusion Diabetes mellitus, type II ICD   Recommendations/Plan:  Patient waiting for a bed at [facility] is open to other hospice facilities around [place] if they have bed availability  Continue full comfort measures  Continue DNR/ DNI as previously documented  Provide frequent assessments and administer PRN medications as clinically necessary to ensure EOL comfort  PMT will continue to follow holistically  Goals of Care and Additional Recommendations:  Limitations on Scope of Treatment: Full Comfort Care  Code Status:    Code Status Orders  (From admission, onward)         Start     Ordered   08/08/20 1451  Do not attempt  resuscitation (DNR)  Continuous       Question Answer Comment  In the event of cardiac or respiratory ARREST Do not call a "code blue"   In the event of cardiac or respiratory ARREST Do not perform Intubation, CPR, defibrillation or ACLS   In the event of cardiac or respiratory ARREST Use medication by any route, position, wound care, and other measures to relive pain and suffering. May use oxygen, suction and manual treatment of airway obstruction as needed for comfort.      08/08/20 1450        Code Status History    Date Active Date Inactive Code Status Order ID Comments User Context   08/01/2020 1722 08/08/2020 1451 DNR [id number]  Roxan Hockey, MD ED   07/06/2020 1938 07/07/2020 2042 DNR [id number]  Murlean Iba, MD ED   12/21/2014 1310 12/22/2014 1514 Full Code [id number]  Leonie Man, MD Inpatient   12/19/2014 1451 12/21/2014 1310 Full Code [id number]  Almyra Deforest, PA Inpatient   Advance Care Planning Activity       Prognosis:   Hours - Days  Discharge Planning:  Hospice facility waiting [facility] bed [facility] or other hospice facility if available  Care plan was discussed with Bruce/son, primary RN  Thank you for allowing the Palliative Medicine Team to assist in the care of this patient.   Total Time 15 minutes Prolonged Time Billed  no        Greater than 50%  of this time was spent counseling and coordinating care related to the above assessment and plan.  Lin Landsman, NP  Please contact Palliative Medicine Team phone at [phone] for questions and concerns.

## 2020-08-09 NOTE — Progress Notes (Signed)
Nutrition Brief Note  Chart reviewed. Patient now receiving full comfort care.  No further nutrition interventions warranted at this time.  Please re-consult as needed.   Lucas Mallow, RD, LDN, CNSC Please refer to [facility] for contact information.

## 2020-08-30 DEATH — deceased

## 2021-08-29 IMAGING — CT CT HEAD W/O CM
4 series · 17 of 47 positions shown, 19 images · non-contrast
Comparison: None.

CLINICAL DATA: Mental status change

EXAM:
CT HEAD WITHOUT CONTRAST
TECHNIQUE: Contiguous axial images were obtained from the base of the skull
through the vertex without intravenous contrast.

[Series 3: head without · axial · non-contrast · 0.43mm/px · z∈[-60,+70]mm · 7 of 36 slices shown, 9 images]
[im 5/36  brain]
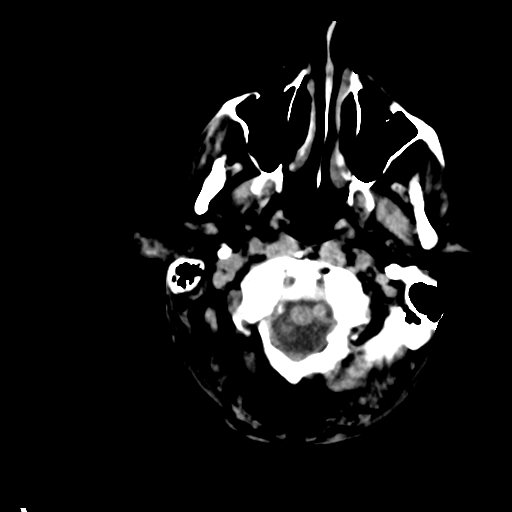
[im 5/36  bone]
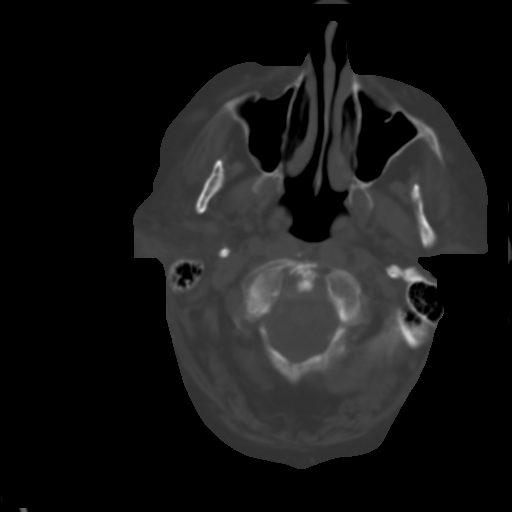
[im 9/36  brain]
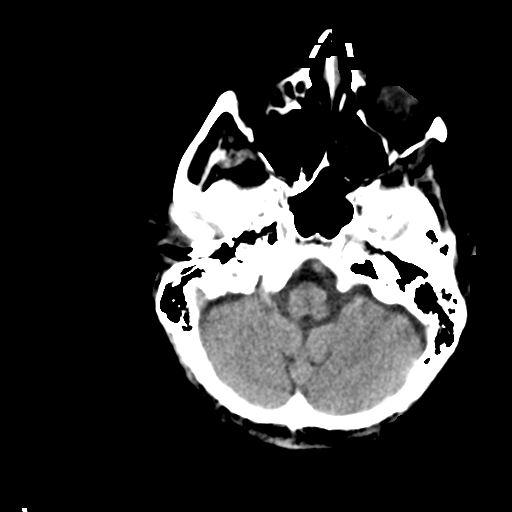
[im 14/36  brain]
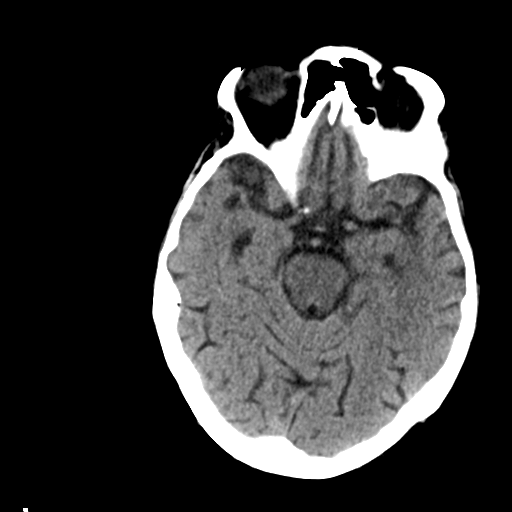
[im 18/36  brain]
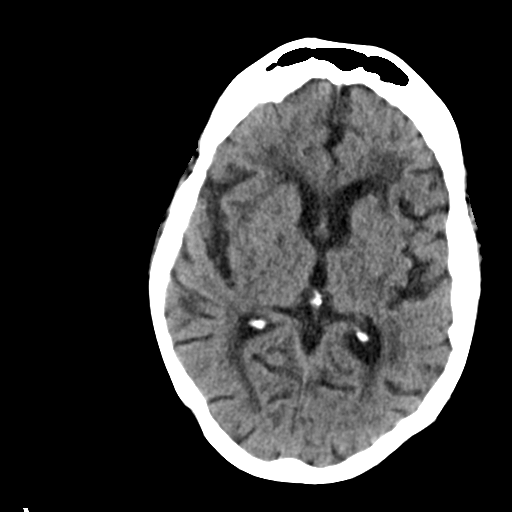
[im 22/36  brain]
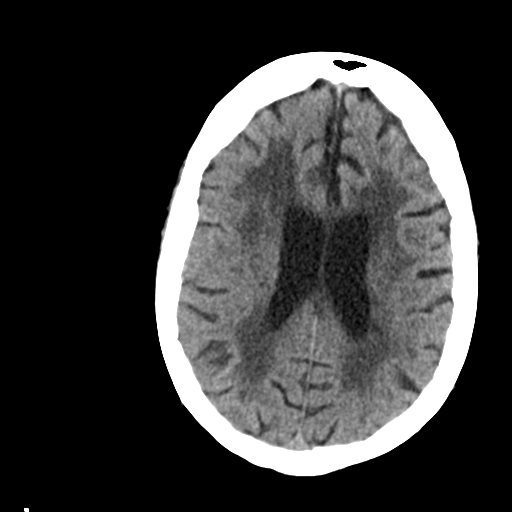
[im 22/36  bone]
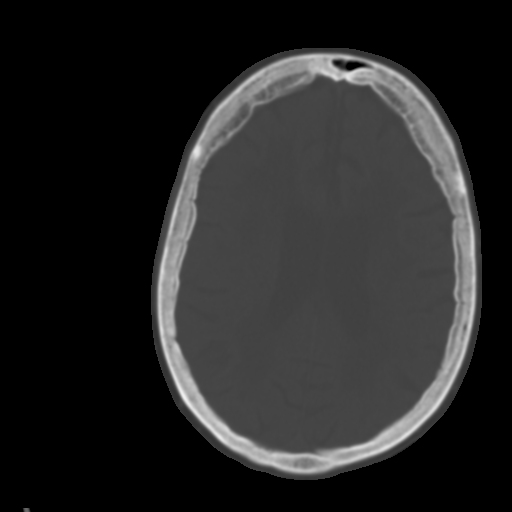
[im 27/36  brain]
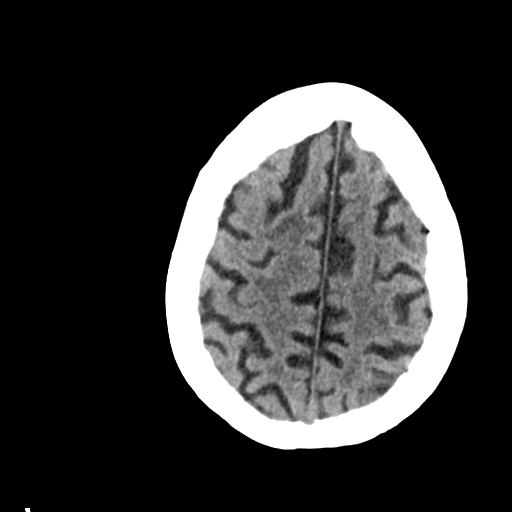
[im 31/36  brain]
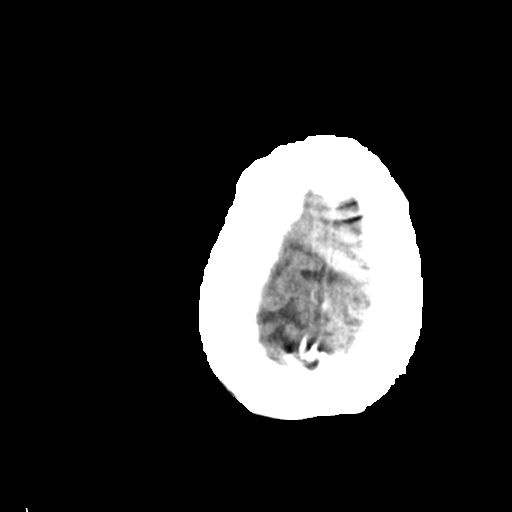

[Series 4: head bone · axial · 0.43mm/px · z∈[-64,-2]mm · 4 of 89 slices shown]
[im 9/89  bone]
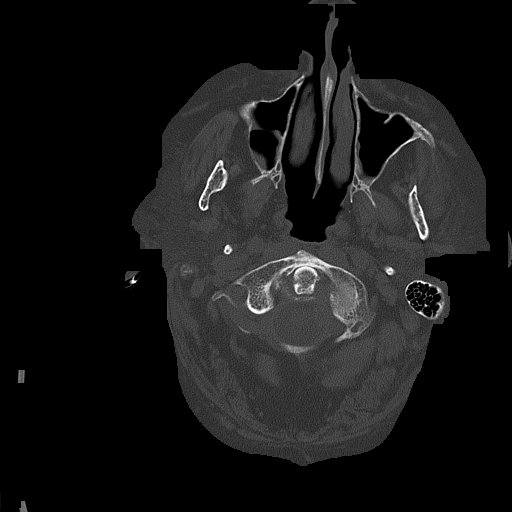
[im 18/89  bone]
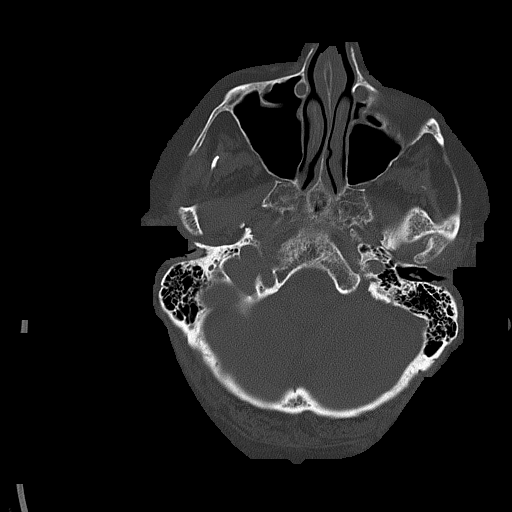
[im 27/89  bone]
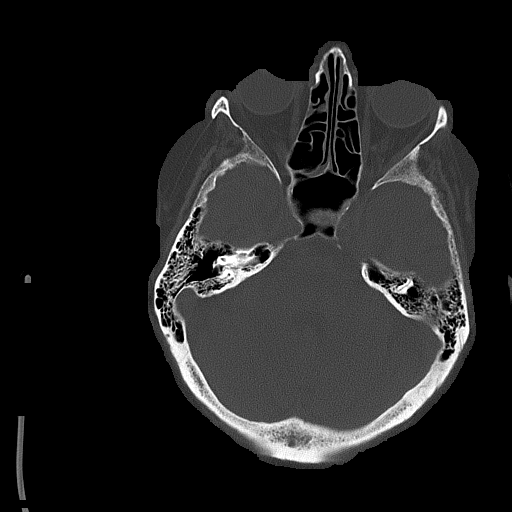
[im 40/89  bone]
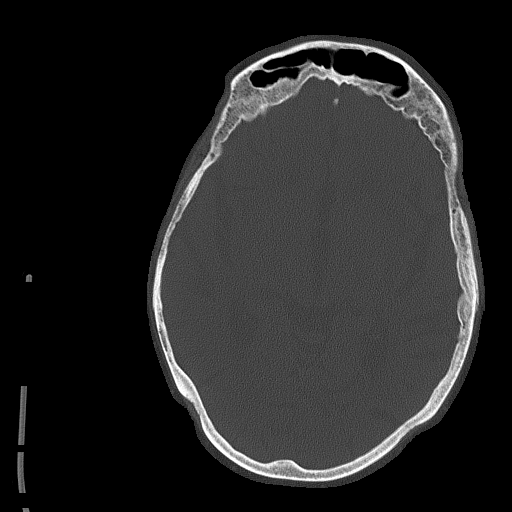

[Series 5: head without cor · coronal · non-contrast · 0.38mm/px · 3 of 73 slices shown]
[im 25/73  brain]
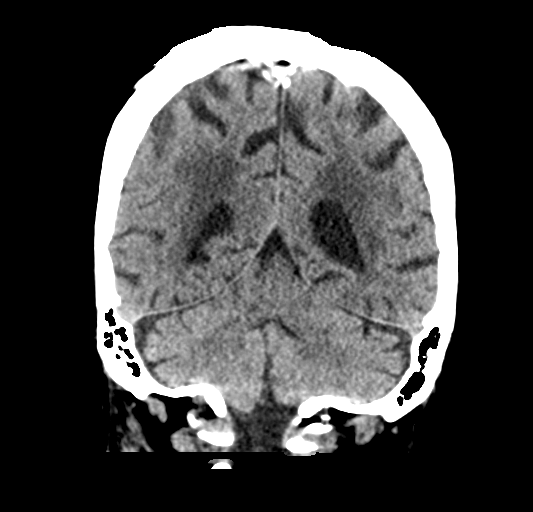
[im 33/73  brain]
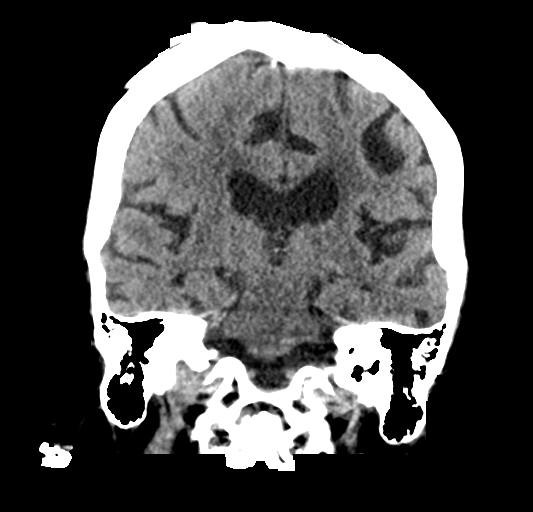
[im 41/73  brain]
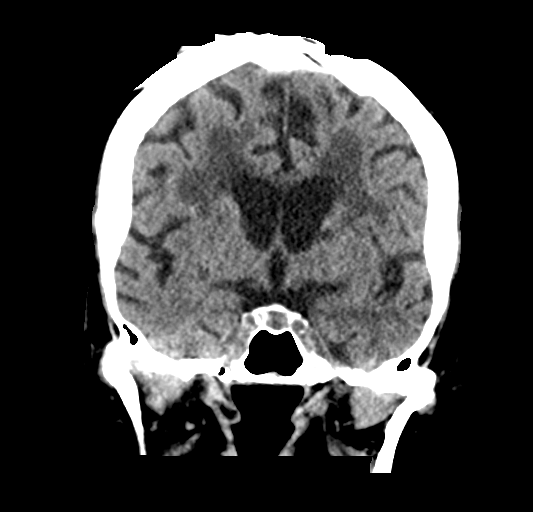

[Series 6: head without sag · sagittal · non-contrast · 0.38mm/px · 3 of 64 slices shown]
[im 22/64  brain]
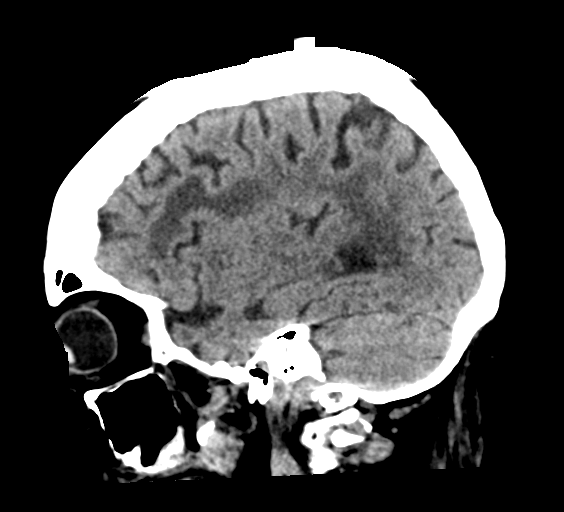
[im 32/64  brain]
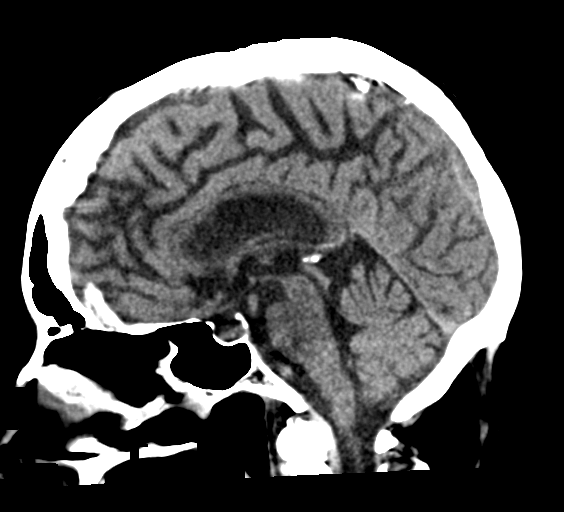
[im 43/64  brain]
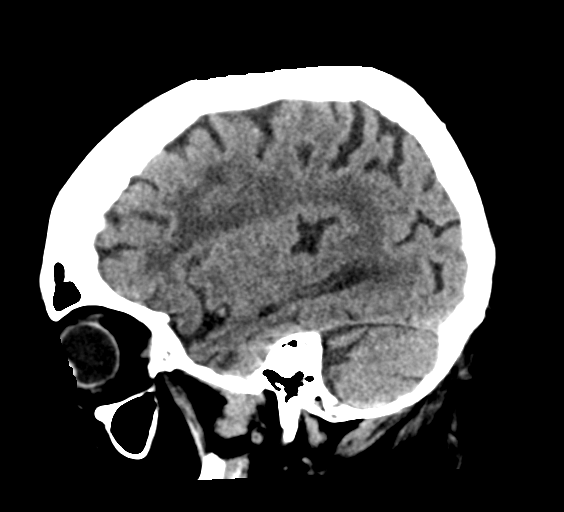

[17 of 47 positions shown; findings below may reference images not displayed]

FINDINGS: Brain: Moderate atrophy. Extensive chronic microvascular ischemic
change throughout the white matter and basal ganglia

Negative for acute infarct, hemorrhage, mass.

Vascular: Negative for hyperdense vessel

Skull: Negative

Sinuses/Orbits: Mild mucosal edema maxillary sinus bilaterally.
Negative orbit.

Other: None
IMPRESSION: No acute abnormality.

Moderate atrophy and extensive chronic microvascular ischemic
change.
